# Patient Record
Sex: Female | Born: 1935 | Race: White | Hispanic: No | Marital: Married | Smoking: Never smoker
Health system: Southern US, Community
[De-identification: ages and names within clinical notes are randomized; demographics above are authoritative.]

## PROBLEM LIST (undated history)

## (undated) DIAGNOSIS — I700 Atherosclerosis of aorta: Secondary | ICD-10-CM

## (undated) DIAGNOSIS — T8859XA Other complications of anesthesia, initial encounter: Secondary | ICD-10-CM

## (undated) DIAGNOSIS — M1990 Unspecified osteoarthritis, unspecified site: Secondary | ICD-10-CM

## (undated) DIAGNOSIS — I10 Essential (primary) hypertension: Secondary | ICD-10-CM

## (undated) DIAGNOSIS — R112 Nausea with vomiting, unspecified: Secondary | ICD-10-CM

## (undated) DIAGNOSIS — T4145XA Adverse effect of unspecified anesthetic, initial encounter: Secondary | ICD-10-CM

## (undated) DIAGNOSIS — B0230 Zoster ocular disease, unspecified: Secondary | ICD-10-CM

## (undated) DIAGNOSIS — Z9889 Other specified postprocedural states: Secondary | ICD-10-CM

## (undated) DIAGNOSIS — K219 Gastro-esophageal reflux disease without esophagitis: Secondary | ICD-10-CM

## (undated) DIAGNOSIS — H409 Unspecified glaucoma: Secondary | ICD-10-CM

## (undated) DIAGNOSIS — H9190 Unspecified hearing loss, unspecified ear: Secondary | ICD-10-CM

## (undated) DIAGNOSIS — E039 Hypothyroidism, unspecified: Secondary | ICD-10-CM

## (undated) DIAGNOSIS — K8020 Calculus of gallbladder without cholecystitis without obstruction: Secondary | ICD-10-CM

## (undated) HISTORY — PX: BREAST SURGERY: SHX581

## (undated) HISTORY — DX: Calculus of gallbladder without cholecystitis without obstruction: K80.20

## (undated) HISTORY — PX: ABDOMINAL HYSTERECTOMY: SHX81

---

## 2001-03-01 ENCOUNTER — Encounter: Payer: Self-pay | Admitting: *Deleted

## 2001-03-01 ENCOUNTER — Emergency Department (HOSPITAL_COMMUNITY): Admission: EM | Admit: 2001-03-01 | Discharge: 2001-03-01 | Payer: Self-pay | Admitting: *Deleted

## 2004-04-05 ENCOUNTER — Ambulatory Visit: Payer: Self-pay | Admitting: Ophthalmology

## 2004-10-26 ENCOUNTER — Ambulatory Visit: Payer: Self-pay | Admitting: Ophthalmology

## 2004-11-08 ENCOUNTER — Ambulatory Visit: Payer: Self-pay | Admitting: Ophthalmology

## 2008-09-01 ENCOUNTER — Emergency Department: Payer: Self-pay | Admitting: Emergency Medicine

## 2013-03-15 ENCOUNTER — Inpatient Hospital Stay (HOSPITAL_COMMUNITY)
Admission: EM | Admit: 2013-03-15 | Discharge: 2013-03-22 | DRG: 871 | Disposition: A | Discharge status: Home Health | Payer: Medicare Other | Attending: Internal Medicine | Admitting: Internal Medicine

## 2013-03-15 ENCOUNTER — Emergency Department (HOSPITAL_COMMUNITY): Payer: Medicare Other

## 2013-03-15 ENCOUNTER — Encounter (HOSPITAL_COMMUNITY): Payer: Self-pay | Admitting: Emergency Medicine

## 2013-03-15 ENCOUNTER — Other Ambulatory Visit: Payer: Self-pay

## 2013-03-15 DIAGNOSIS — I10 Essential (primary) hypertension: Secondary | ICD-10-CM

## 2013-03-15 DIAGNOSIS — T40605A Adverse effect of unspecified narcotics, initial encounter: Secondary | ICD-10-CM | POA: Diagnosis present

## 2013-03-15 DIAGNOSIS — D539 Nutritional anemia, unspecified: Secondary | ICD-10-CM | POA: Diagnosis present

## 2013-03-15 DIAGNOSIS — E0590 Thyrotoxicosis, unspecified without thyrotoxic crisis or storm: Secondary | ICD-10-CM | POA: Diagnosis present

## 2013-03-15 DIAGNOSIS — E039 Hypothyroidism, unspecified: Secondary | ICD-10-CM | POA: Diagnosis present

## 2013-03-15 DIAGNOSIS — K8309 Other cholangitis: Secondary | ICD-10-CM | POA: Diagnosis present

## 2013-03-15 DIAGNOSIS — N183 Chronic kidney disease, stage 3 unspecified: Secondary | ICD-10-CM | POA: Diagnosis present

## 2013-03-15 DIAGNOSIS — N179 Acute kidney failure, unspecified: Secondary | ICD-10-CM | POA: Diagnosis present

## 2013-03-15 DIAGNOSIS — T424X5A Adverse effect of benzodiazepines, initial encounter: Secondary | ICD-10-CM | POA: Diagnosis present

## 2013-03-15 DIAGNOSIS — IMO0002 Reserved for concepts with insufficient information to code with codable children: Secondary | ICD-10-CM

## 2013-03-15 DIAGNOSIS — Z66 Do not resuscitate: Secondary | ICD-10-CM | POA: Diagnosis present

## 2013-03-15 DIAGNOSIS — J9600 Acute respiratory failure, unspecified whether with hypoxia or hypercapnia: Secondary | ICD-10-CM | POA: Diagnosis present

## 2013-03-15 DIAGNOSIS — I5031 Acute diastolic (congestive) heart failure: Secondary | ICD-10-CM | POA: Diagnosis present

## 2013-03-15 DIAGNOSIS — A419 Sepsis, unspecified organism: Principal | ICD-10-CM | POA: Diagnosis present

## 2013-03-15 DIAGNOSIS — K219 Gastro-esophageal reflux disease without esophagitis: Secondary | ICD-10-CM | POA: Diagnosis present

## 2013-03-15 DIAGNOSIS — I129 Hypertensive chronic kidney disease with stage 1 through stage 4 chronic kidney disease, or unspecified chronic kidney disease: Secondary | ICD-10-CM | POA: Diagnosis present

## 2013-03-15 DIAGNOSIS — K8050 Calculus of bile duct without cholangitis or cholecystitis without obstruction: Secondary | ICD-10-CM | POA: Diagnosis present

## 2013-03-15 DIAGNOSIS — I509 Heart failure, unspecified: Secondary | ICD-10-CM | POA: Diagnosis present

## 2013-03-15 DIAGNOSIS — K5710 Diverticulosis of small intestine without perforation or abscess without bleeding: Secondary | ICD-10-CM | POA: Diagnosis present

## 2013-03-15 DIAGNOSIS — E860 Dehydration: Secondary | ICD-10-CM | POA: Diagnosis present

## 2013-03-15 DIAGNOSIS — K8051 Calculus of bile duct without cholangitis or cholecystitis with obstruction: Secondary | ICD-10-CM | POA: Diagnosis present

## 2013-03-15 DIAGNOSIS — E46 Unspecified protein-calorie malnutrition: Secondary | ICD-10-CM | POA: Diagnosis present

## 2013-03-15 DIAGNOSIS — R7989 Other specified abnormal findings of blood chemistry: Secondary | ICD-10-CM | POA: Diagnosis present

## 2013-03-15 DIAGNOSIS — Z87440 Personal history of urinary (tract) infections: Secondary | ICD-10-CM

## 2013-03-15 DIAGNOSIS — N289 Disorder of kidney and ureter, unspecified: Secondary | ICD-10-CM

## 2013-03-15 HISTORY — DX: Atherosclerosis of aorta: I70.0

## 2013-03-15 HISTORY — DX: Gastro-esophageal reflux disease without esophagitis: K21.9

## 2013-03-15 HISTORY — DX: Essential (primary) hypertension: I10

## 2013-03-15 LAB — CBC WITH DIFFERENTIAL/PLATELET
Basophils Absolute: 0 10*3/uL (ref 0.0–0.1)
Basophils Relative: 0 % (ref 0–1)
Eosinophils Absolute: 0 10*3/uL (ref 0.0–0.7)
Eosinophils Relative: 0 % (ref 0–5)
HCT: 36.4 % (ref 36.0–46.0)
Hemoglobin: 12.5 g/dL (ref 12.0–15.0)
Lymphocytes Relative: 5 % — ABNORMAL LOW (ref 12–46)
Lymphs Abs: 0.8 10*3/uL (ref 0.7–4.0)
MCH: 35.9 pg — ABNORMAL HIGH (ref 26.0–34.0)
MCHC: 34.3 g/dL (ref 30.0–36.0)
MCV: 104.6 fL — ABNORMAL HIGH (ref 78.0–100.0)
Monocytes Absolute: 0.9 10*3/uL (ref 0.1–1.0)
Monocytes Relative: 5 % (ref 3–12)
Neutro Abs: 16.5 10*3/uL — ABNORMAL HIGH (ref 1.7–7.7)
Neutrophils Relative %: 91 % — ABNORMAL HIGH (ref 43–77)
Platelets: 340 10*3/uL (ref 150–400)
RBC: 3.48 MIL/uL — ABNORMAL LOW (ref 3.87–5.11)
RDW: 16.9 % — ABNORMAL HIGH (ref 11.5–15.5)
WBC: 18.2 10*3/uL — ABNORMAL HIGH (ref 4.0–10.5)

## 2013-03-15 MED ORDER — ONDANSETRON HCL 4 MG/2ML IJ SOLN: 4.0000 mg | Freq: Once | INTRAMUSCULAR | Status: DC

## 2013-03-15 MED ORDER — IOHEXOL 300 MG/ML  SOLN
50.0000 mL | Freq: Once | INTRAMUSCULAR | Status: AC | PRN
  Administered 2013-03-15: 50 mL via ORAL

## 2013-03-15 MED ORDER — MORPHINE SULFATE 4 MG/ML IJ SOLN
4.0000 mg | INTRAMUSCULAR | Status: DC | PRN
  Administered 2013-03-16: 4 mg via INTRAVENOUS
  Filled 2013-03-15: qty 1

## 2013-03-15 MED ORDER — ONDANSETRON HCL 4 MG/2ML IJ SOLN
4.0000 mg | Freq: Once | INTRAMUSCULAR | Status: AC
  Administered 2013-03-15: 4 mg via INTRAVENOUS
  Filled 2013-03-15: qty 2

## 2013-03-15 MED ORDER — SODIUM CHLORIDE 0.9 % IV BOLUS (SEPSIS)
500.0000 mL | Freq: Once | INTRAVENOUS | Status: AC
  Administered 2013-03-16: 500 mL via INTRAVENOUS

## 2013-03-15 NOTE — ED Provider Notes (Signed)
CSN: [id number]     Arrival date & time 03/15/13  2154 History  This chart was scribed for Raeford Razor, MD by Blanchard Kelch, ED Scribe. The patient was seen in room APA09/APA09. Patient's care was started at 11:24 PM.    Chief Complaint  Patient presents with  . Abdominal Pain    Patient is a 80 y.o. female presenting with abdominal pain. The history is provided by the patient and a relative. No language interpreter was used.  Abdominal Pain Pain location:  Epigastric Onset quality:  Sudden Duration:  1 week Timing:  Intermittent Progression:  Worsening Chronicity:  New Associated symptoms: diarrhea, fever and nausea   Associated symptoms: no constipation   Risk factors: being elderly     HPI Comments: Erica Dunn is a 80 y.o. female brought in by ambulance, who presents to the Emergency Department complaining of intermittent episodes of epigastric abdominal pain that began about a week ago but became constant and worsened today. She has associated nausea, yellow diarrhea, subjective intermittent fever and her daughter states her skin color changed to yellow a week ago. She was given Tramadol but was unable to tolerate it. She denies back pain, dysuria, hematuria or leg swelling. She has not been seen by a physician for the pain. She reports still having her gall bladder. She has a past surgical history of a cesarean section. She denies any allergies to medications that she knows of.    Her PCP is Dr. Iran Ouch.  Past Medical History  Diagnosis Date  . GERD (gastroesophageal reflux disease)    History reviewed. No pertinent past surgical history. No family history on file. History  Substance Use Topics  . Smoking status: Never Smoker   . Smokeless tobacco: Not on file  . Alcohol Use: No   OB History   Grav Para Term Preterm Abortions TAB SAB Ect Mult Living                 Review of Systems  Constitutional: Positive for fever.  Gastrointestinal: Positive for nausea,  abdominal pain and diarrhea. Negative for constipation.  Skin: Positive for color change.  All other systems reviewed and are negative.    Allergies  Review of patient's allergies indicates no known allergies.  Home Medications   Current Outpatient Rx  Name  Route  Sig  Dispense  Refill  . acyclovir (ZOVIRAX) 800 MG tablet   Oral   Take 800 mg by mouth 2 (two) times daily.         . beta carotene w/minerals (OCUVITE) tablet   Oral   Take 1 tablet by mouth 2 (two) times daily.         . Calcium Carbonate-Vitamin D (CALCIUM 600+D) 600-400 MG-UNIT per tablet   Oral   Take 1 tablet by mouth 2 (two) times daily.         . cyanocobalamin (,VITAMIN B-12,) 1000 MCG/ML injection   Intramuscular   Inject 1,000 mcg into the muscle every 30 (thirty) days.         . Difluprednate (DUREZOL) 0.05 % EMUL   Left Eye   Place 1 drop into the left eye daily.         . enalapril (VASOTEC) 20 MG tablet   Oral   Take 20 mg by mouth daily.         . furosemide (LASIX) 20 MG tablet   Oral   Take 20 mg by mouth daily.         Marland Kitchen  levothyroxine (SYNTHROID, LEVOTHROID) 50 MCG tablet   Oral   Take 50 mcg by mouth daily before breakfast.         . omeprazole (PRILOSEC) 20 MG capsule   Oral   Take 20 mg by mouth 2 (two) times daily.         . timolol (TIMOPTIC) 0.5 % ophthalmic solution   Right Eye   Place 1 drop into the right eye daily.         . Vitamin D, Ergocalciferol, (DRISDOL) 50000 UNITS CAPS capsule   Oral   Take 50,000 Units by mouth every 30 (thirty) days.          Triage Vitals: BP 160/77  Pulse 75  Temp(Src) 97.8 F (36.6 C) (Oral)  Resp 18  Wt 100 lb (45.36 kg)  SpO2 98%  Physical Exam  Nursing note and vitals reviewed. Constitutional: She is oriented to person, place, and time. She appears well-developed and well-nourished. No distress.  Frail and ill appearing. Jaundiced.   HENT:  Head: Normocephalic and atraumatic.  Right Ear: External  ear normal.  Left Ear: External ear normal.  Eyes: Pupils are equal, round, and reactive to light. Scleral icterus is present.  Neck: Neck supple.  Cardiovascular: Normal rate, regular rhythm and normal heart sounds.   Pulmonary/Chest: Effort normal. No respiratory distress. She has no wheezes.  Abdominal: Soft. Bowel sounds are normal. She exhibits no distension. There is tenderness in the epigastric area. There is guarding (voluntary). There is no rebound.  Midline surgical scar lower abdomen.   Neurological: She is alert and oriented to person, place, and time.  Skin: Skin is warm and dry. She is not diaphoretic.  Psychiatric: She has a normal mood and affect.    ED Course  Procedures (including critical care time)  DIAGNOSTIC STUDIES: Oxygen Saturation is 98% on room air, normal by my interpretation.    COORDINATION OF CARE: 11:28 PM -Will order CBC, CMP, blood lipase, CT abdomen and zofran injection. Patient verbalizes understanding and agrees with treatment plan.    Labs Review Labs Reviewed  CBC WITH DIFFERENTIAL - Abnormal; Notable for the following:    WBC 18.2 (*)    RBC 3.48 (*)    MCV 104.6 (*)    MCH 35.9 (*)    RDW 16.9 (*)    Neutrophils Relative % 91 (*)    Neutro Abs 16.5 (*)    Lymphocytes Relative 5 (*)    All other components within normal limits  COMPREHENSIVE METABOLIC PANEL - Abnormal; Notable for the following:    Chloride 94 (*)    Glucose, Bld 169 (*)    Creatinine, Ser 1.66 (*)    Albumin 3.2 (*)    AST 206 (*)    ALT 88 (*)    Alkaline Phosphatase 573 (*)    Total Bilirubin 11.8 (*)    GFR calc non Af Amer 26 (*)    GFR calc Af Amer 30 (*)    All other components within normal limits  LIPASE, BLOOD   Imaging Review Ct Abdomen Pelvis Wo Contrast  03/16/2013   CLINICAL DATA:  Nausea and upper abdominal pain.  EXAM: CT ABDOMEN AND PELVIS WITHOUT CONTRAST  TECHNIQUE: Multidetector CT imaging of the abdomen and pelvis was performed following  the standard protocol without intravenous contrast.  COMPARISON:  None.  FINDINGS: A trace right pleural effusion is noted, with associated atelectasis. Mild apparent wall thickening at the distal esophagus may reflect sequelae of gastroesophageal  reflux.  Scattered calcified granulomata are seen within the liver and spleen. There is prominence of the intrahepatic biliary ducts. The common hepatic duct is dilated to 1.5 cm in diameter, with two obstructing stones seen distally at the level of the pancreatic head, measuring 1.1 cm and 1.0 cm. The gallbladder is markedly distended, with a 2.9 cm focus of mildly increased attenuation possibly reflecting a sludge ball or stone.  The spleen is otherwise unremarkable in appearance. The pancreas and adrenal glands are within normal limits.  The kidneys are unremarkable in appearance. There is no evidence of hydronephrosis. No renal or ureteral stones are seen. No perinephric stranding is appreciated.  No free fluid is identified. The small bowel is unremarkable in appearance. The stomach is within normal limits. No acute vascular abnormalities are seen. Diffuse calcification is noted along the abdominal aorta and its branches.  The appendix is normal in caliber, without evidence for appendicitis. Stool is seen partially filling the colon. Scattered diverticulosis is noted along the sigmoid colon, without evidence of diverticulitis.  The bladder is mildly distended and grossly unremarkable in appearance. The patient is status post hysterectomy. The ovaries are relatively symmetric; no suspicious adnexal masses are seen. No inguinal lymphadenopathy is seen.  No acute osseous abnormalities are identified. Vacuum phenomenon is noted at L3-L4.  IMPRESSION: 1. Markedly distended gallbladder, with a 2.9 cm focus of mildly increased attenuation possibly reflecting a large stone or sludge ball. Dilatation of the common hepatic duct to 1.5 cm in diameter, with two obstructing stones  seen distally at the level of the pancreatic head, measuring 1.1 cm and 1.0 cm. Associated prominence of the intrahepatic biliary ducts. ERCP would be helpful for further evaluation and treatment. 2. Trace right pleural effusion, with associated atelectasis. 3. Mild apparent wall thickening at the distal esophagus may reflect sequelae of gastroesophageal reflux. 4. Diffuse calcification along the abdominal aorta and its branches. 5. Scattered diverticulosis along the sigmoid colon, without evidence of diverticulitis.  These results were called by telephone at the time of interpretation on 03/16/2013 at 1:57 AM to Dr. Raeford RazorSTEPHEN Sabri Teal, who verbally acknowledged these results.   Electronically Signed   By: Roanna RaiderJeffery  Chang M.D.   On: 03/16/2013 01:57    EKG Interpretation   None       MDM   1. Choledocholithiasis with obstruction   2. Renal insufficiency    89yF with abdominal pain, n/v and jaundice. CT significant for choledocholithiasis/cholelithiasis. Afebrile in ED, but subjective fever and leukocytosis. No hypotension. Mentating at baseline. Consider cholangitis, but not clinically convincing. Abx ordered though. Discussed with Dr Karilyn Cotaehman, GI. Plans ERCP later this morning.   3:11 AM Pt admitted. Notified by nursing that hypotensive now after being consistently hypertensive. Pt with no new complaints. Says she feels much better than on arrival. Abdominal exam unchanged from initial. Has been a couple hours since last dose morphine. BP confirmed manually. IVF bolus ordered. Discussed with Dr Sharl MaLama in ED. Will change bed to stepdown. Will continue to monitor while in ED.   I personally preformed the services scribed in my presence. The recorded information has been reviewed is accurate. Raeford RazorStephen Razan Siler, MD.    Raeford RazorStephen Jionni Helming, MD 03/16/13 [phone]

## 2013-03-15 NOTE — ED Notes (Signed)
Patient presents to ER via [facility] with c/o epigastric pain, worse when she eats with nausea and vomiting.

## 2013-03-15 NOTE — ED Notes (Signed)
Patient states she thinks if she could vomit, she would feel better.

## 2013-03-16 ENCOUNTER — Inpatient Hospital Stay (HOSPITAL_COMMUNITY): Payer: Medicare Other

## 2013-03-16 ENCOUNTER — Encounter (HOSPITAL_COMMUNITY)
Admission: EM | Disposition: A | Discharge status: Home Health | Payer: Self-pay | Source: Home / Self Care | Attending: Internal Medicine

## 2013-03-16 ENCOUNTER — Encounter (HOSPITAL_COMMUNITY): Payer: Self-pay | Admitting: *Deleted

## 2013-03-16 ENCOUNTER — Encounter (HOSPITAL_COMMUNITY): Payer: Medicare Other | Admitting: Anesthesiology

## 2013-03-16 ENCOUNTER — Inpatient Hospital Stay (HOSPITAL_COMMUNITY): Payer: Medicare Other | Admitting: Anesthesiology

## 2013-03-16 DIAGNOSIS — K8051 Calculus of bile duct without cholangitis or cholecystitis with obstruction: Secondary | ICD-10-CM

## 2013-03-16 DIAGNOSIS — A419 Sepsis, unspecified organism: Secondary | ICD-10-CM | POA: Diagnosis not present

## 2013-03-16 DIAGNOSIS — I10 Essential (primary) hypertension: Secondary | ICD-10-CM

## 2013-03-16 DIAGNOSIS — K8309 Other cholangitis: Secondary | ICD-10-CM

## 2013-03-16 DIAGNOSIS — N179 Acute kidney failure, unspecified: Secondary | ICD-10-CM | POA: Diagnosis not present

## 2013-03-16 DIAGNOSIS — I5031 Acute diastolic (congestive) heart failure: Secondary | ICD-10-CM | POA: Diagnosis not present

## 2013-03-16 DIAGNOSIS — K8050 Calculus of bile duct without cholangitis or cholecystitis without obstruction: Secondary | ICD-10-CM | POA: Diagnosis present

## 2013-03-16 DIAGNOSIS — J9600 Acute respiratory failure, unspecified whether with hypoxia or hypercapnia: Secondary | ICD-10-CM | POA: Diagnosis not present

## 2013-03-16 HISTORY — PX: REMOVAL OF STONES: SHX5545

## 2013-03-16 HISTORY — PX: SPHINCTEROTOMY: SHX5544

## 2013-03-16 HISTORY — PX: ERCP: SHX5425

## 2013-03-16 HISTORY — PX: BILIARY STENT PLACEMENT: SHX5538

## 2013-03-16 HISTORY — PX: BALLOON DILATION: SHX5330

## 2013-03-16 LAB — BLOOD GAS, ARTERIAL
Acid-base deficit: 2.8 mmol/L — ABNORMAL HIGH (ref 0.0–2.0)
Bicarbonate: 23 mEq/L (ref 20.0–24.0)
DRAWN BY: 234301
EXPIRATORY PAP: 5
FIO2: 100 %
INSPIRATORY PAP: 20
Mode: POSITIVE
O2 Saturation: 99.3 %
PCO2 ART: 51.4 mmHg — AB (ref 35.0–45.0)
PO2 ART: 259 mmHg — AB (ref 80.0–100.0)
Patient temperature: 37
TCO2: 20 mmol/L (ref 0–100)
pH, Arterial: 7.274 — ABNORMAL LOW (ref 7.350–7.450)

## 2013-03-16 LAB — COMPREHENSIVE METABOLIC PANEL
ALT: 88 U/L — ABNORMAL HIGH (ref 0–35)
ALT: 68 U/L — ABNORMAL HIGH (ref 0–35)
AST: 206 U/L — ABNORMAL HIGH (ref 0–37)
AST: 157 U/L — AB (ref 0–37)
Albumin: 3.2 g/dL — ABNORMAL LOW (ref 3.5–5.2)
Albumin: 2.4 g/dL — ABNORMAL LOW (ref 3.5–5.2)
Alkaline Phosphatase: 573 U/L — ABNORMAL HIGH (ref 39–117)
Alkaline Phosphatase: 438 U/L — ABNORMAL HIGH (ref 39–117)
BUN: 20 mg/dL (ref 6–23)
BUN: 21 mg/dL (ref 6–23)
CHLORIDE: 101 meq/L (ref 96–112)
CO2: 29 mEq/L (ref 19–32)
CO2: 26 meq/L (ref 19–32)
CREATININE: 1.55 mg/dL — AB (ref 0.50–1.10)
Calcium: 10.1 mg/dL (ref 8.4–10.5)
Calcium: 8.5 mg/dL (ref 8.4–10.5)
Chloride: 94 mEq/L — ABNORMAL LOW (ref 96–112)
Creatinine, Ser: 1.66 mg/dL — ABNORMAL HIGH (ref 0.50–1.10)
GFR calc Af Amer: 30 mL/min — ABNORMAL LOW (ref >90)
GFR calc Af Amer: 33 mL/min — ABNORMAL LOW (ref >90)
GFR calc non Af Amer: 26 mL/min — ABNORMAL LOW (ref >90)
GFR, EST NON AFRICAN AMERICAN: 28 mL/min — AB (ref >90)
Glucose, Bld: 169 mg/dL — ABNORMAL HIGH (ref 70–99)
Glucose, Bld: 134 mg/dL — ABNORMAL HIGH (ref 70–99)
Potassium: 4.2 mEq/L (ref 3.7–5.3)
Potassium: 3.9 mEq/L (ref 3.7–5.3)
Sodium: 140 mEq/L (ref 137–147)
Sodium: 142 mEq/L (ref 137–147)
Total Bilirubin: 11.8 mg/dL — ABNORMAL HIGH (ref 0.3–1.2)
Total Bilirubin: 10.7 mg/dL — ABNORMAL HIGH (ref 0.3–1.2)
Total Protein: 8.3 g/dL (ref 6.0–8.3)
Total Protein: 6.8 g/dL (ref 6.0–8.3)

## 2013-03-16 LAB — TSH: TSH: 0.845 u[IU]/mL (ref 0.350–4.500)

## 2013-03-16 LAB — CBC
HCT: 31.9 % — ABNORMAL LOW (ref 36.0–46.0)
Hemoglobin: 10.8 g/dL — ABNORMAL LOW (ref 12.0–15.0)
MCH: 35.6 pg — AB (ref 26.0–34.0)
MCHC: 33.9 g/dL (ref 30.0–36.0)
MCV: 105.3 fL — ABNORMAL HIGH (ref 78.0–100.0)
PLATELETS: 261 10*3/uL (ref 150–400)
RBC: 3.03 MIL/uL — AB (ref 3.87–5.11)
RDW: 18.1 % — ABNORMAL HIGH (ref 11.5–15.5)
WBC: 20.5 10*3/uL — ABNORMAL HIGH (ref 4.0–10.5)

## 2013-03-16 LAB — PROTIME-INR
INR: 1.08 (ref 0.00–1.49)
PROTHROMBIN TIME: 13.8 s (ref 11.6–15.2)

## 2013-03-16 LAB — PRO B NATRIURETIC PEPTIDE: PRO B NATRI PEPTIDE: 7350 pg/mL — AB (ref 0–450)

## 2013-03-16 LAB — LIPASE, BLOOD: Lipase: 27 U/L (ref 11–59)

## 2013-03-16 LAB — SURGICAL PCR SCREEN
MRSA, PCR: NEGATIVE
STAPHYLOCOCCUS AUREUS: NEGATIVE

## 2013-03-16 SURGERY — ERCP, WITH INTERVENTION IF INDICATED
Anesthesia: General

## 2013-03-16 MED ORDER — ONDANSETRON HCL 4 MG/2ML IJ SOLN
INTRAMUSCULAR | Status: AC
  Filled 2013-03-16: qty 2

## 2013-03-16 MED ORDER — GLUCAGON HCL (RDNA) 1 MG IJ SOLR
INTRAMUSCULAR | Status: DC | PRN
  Administered 2013-03-16 (×3): 0.25 mg via INTRAVENOUS

## 2013-03-16 MED ORDER — ONDANSETRON HCL 4 MG PO TABS: 4.0000 mg | ORAL_TABLET | Freq: Four times a day (QID) | ORAL | Status: DC | PRN

## 2013-03-16 MED ORDER — GLYCOPYRROLATE 0.2 MG/ML IJ SOLN
INTRAMUSCULAR | Status: AC
  Filled 2013-03-16: qty 1

## 2013-03-16 MED ORDER — PHENYLEPHRINE HCL 10 MG/ML IJ SOLN
INTRAMUSCULAR | Status: AC
  Filled 2013-03-16: qty 1

## 2013-03-16 MED ORDER — LIDOCAINE HCL (CARDIAC) 20 MG/ML IV SOLN
INTRAVENOUS | Status: DC | PRN
  Administered 2013-03-16: 50 mg via INTRAVENOUS

## 2013-03-16 MED ORDER — LEVOTHYROXINE SODIUM 50 MCG PO TABS
50.0000 ug | ORAL_TABLET | Freq: Every day | ORAL | Status: DC
  Administered 2013-03-16 – 2013-03-22 (×7): 50 ug via ORAL
  Filled 2013-03-16: qty 2
  Filled 2013-03-16: qty 1
  Filled 2013-03-16 (×2): qty 2
  Filled 2013-03-16: qty 1
  Filled 2013-03-16: qty 2
  Filled 2013-03-16: qty 1

## 2013-03-16 MED ORDER — FENTANYL CITRATE 0.05 MG/ML IJ SOLN
25.0000 ug | INTRAMUSCULAR | Status: DC | PRN
  Administered 2013-03-16 (×2): 25 ug via INTRAVENOUS

## 2013-03-16 MED ORDER — SODIUM CHLORIDE 0.9 % IV SOLN
INTRAVENOUS | Status: DC
  Administered 2013-03-16: 100 mL/h via INTRAVENOUS
  Administered 2013-03-17: 03:00:00 via INTRAVENOUS

## 2013-03-16 MED ORDER — SODIUM CHLORIDE 0.9 % IV BOLUS (SEPSIS)
1000.0000 mL | Freq: Once | INTRAVENOUS | Status: AC
  Administered 2013-03-16: 1000 mL via INTRAVENOUS

## 2013-03-16 MED ORDER — SODIUM CHLORIDE 0.9 % IV SOLN: INTRAVENOUS | Status: DC

## 2013-03-16 MED ORDER — NALOXONE HCL 0.4 MG/ML IJ SOLN
0.4000 mg | INTRAMUSCULAR | Status: DC | PRN
  Administered 2013-03-16 (×3): 0.4 mg via INTRAVENOUS

## 2013-03-16 MED ORDER — ETOMIDATE 2 MG/ML IV SOLN
INTRAVENOUS | Status: AC
  Filled 2013-03-16: qty 10

## 2013-03-16 MED ORDER — MORPHINE SULFATE 2 MG/ML IJ SOLN
2.0000 mg | INTRAMUSCULAR | Status: DC | PRN
  Filled 2013-03-16: qty 1

## 2013-03-16 MED ORDER — STERILE WATER FOR IRRIGATION IR SOLN
Status: DC | PRN
  Administered 2013-03-16: 11:00:00

## 2013-03-16 MED ORDER — FLUMAZENIL 0.5 MG/5ML IV SOLN
INTRAVENOUS | Status: AC
  Administered 2013-03-16 (×2): 0.2 mg
  Filled 2013-03-16: qty 5

## 2013-03-16 MED ORDER — MIDAZOLAM HCL 2 MG/2ML IJ SOLN
INTRAMUSCULAR | Status: AC
  Filled 2013-03-16: qty 2

## 2013-03-16 MED ORDER — PHENYLEPHRINE HCL 10 MG/ML IJ SOLN
INTRAMUSCULAR | Status: DC | PRN
  Administered 2013-03-16 (×2): 100 ug via INTRAVENOUS

## 2013-03-16 MED ORDER — LIDOCAINE HCL (PF) 1 % IJ SOLN
INTRAMUSCULAR | Status: AC
  Filled 2013-03-16: qty 5

## 2013-03-16 MED ORDER — HYDRALAZINE HCL 25 MG PO TABS: 25.0000 mg | ORAL_TABLET | Freq: Four times a day (QID) | ORAL | Status: DC | PRN

## 2013-03-16 MED ORDER — FENTANYL CITRATE 0.05 MG/ML IJ SOLN
INTRAMUSCULAR | Status: AC
  Filled 2013-03-16: qty 5

## 2013-03-16 MED ORDER — SODIUM CHLORIDE 0.9 % IV SOLN
INTRAVENOUS | Status: DC
  Administered 2013-03-16: 06:00:00 via INTRAVENOUS

## 2013-03-16 MED ORDER — TIMOLOL MALEATE 0.5 % OP SOLN
1.0000 [drp] | Freq: Every day | OPHTHALMIC | Status: DC
  Administered 2013-03-16 – 2013-03-22 (×7): 1 [drp] via OPHTHALMIC
  Filled 2013-03-16 (×2): qty 5

## 2013-03-16 MED ORDER — OCUVITE PO TABS
1.0000 | ORAL_TABLET | Freq: Two times a day (BID) | ORAL | Status: DC
  Administered 2013-03-16 – 2013-03-22 (×11): 1 via ORAL
  Filled 2013-03-16 (×15): qty 1

## 2013-03-16 MED ORDER — ACYCLOVIR 800 MG PO TABS
800.0000 mg | ORAL_TABLET | Freq: Two times a day (BID) | ORAL | Status: DC
  Administered 2013-03-16 – 2013-03-22 (×12): 800 mg via ORAL
  Filled 2013-03-16 (×14): qty 1

## 2013-03-16 MED ORDER — PREDNISOLONE ACETATE 1 % OP SUSP
1.0000 [drp] | Freq: Every day | OPHTHALMIC | Status: DC
  Administered 2013-03-16 – 2013-03-22 (×7): 1 [drp] via OPHTHALMIC
  Filled 2013-03-16: qty 1

## 2013-03-16 MED ORDER — ROCURONIUM BROMIDE 50 MG/5ML IV SOLN
INTRAVENOUS | Status: AC
  Filled 2013-03-16: qty 1

## 2013-03-16 MED ORDER — PIPERACILLIN-TAZOBACTAM IN DEX 2-0.25 GM/50ML IV SOLN
2.2500 g | Freq: Three times a day (TID) | INTRAVENOUS | Status: DC
  Administered 2013-03-16 – 2013-03-20 (×13): 2.25 g via INTRAVENOUS
  Filled 2013-03-16 (×13): qty 50

## 2013-03-16 MED ORDER — ENALAPRIL MALEATE 5 MG PO TABS: 20.0000 mg | ORAL_TABLET | Freq: Every day | ORAL | Status: DC

## 2013-03-16 MED ORDER — SODIUM CHLORIDE BACTERIOSTATIC 0.9 % IJ SOLN
INTRAMUSCULAR | Status: AC
  Filled 2013-03-16: qty 10

## 2013-03-16 MED ORDER — ONDANSETRON HCL 4 MG/2ML IJ SOLN: 4.0000 mg | Freq: Once | INTRAMUSCULAR | Status: AC | PRN

## 2013-03-16 MED ORDER — FUROSEMIDE 10 MG/ML IJ SOLN
40.0000 mg | Freq: Once | INTRAMUSCULAR | Status: AC
  Administered 2013-03-16: 40 mg via INTRAVENOUS
  Filled 2013-03-16: qty 4

## 2013-03-16 MED ORDER — PROMETHAZINE HCL 25 MG/ML IJ SOLN
6.2500 mg | Freq: Once | INTRAMUSCULAR | Status: AC
  Administered 2013-03-16: 6.25 mg via INTRAVENOUS
  Filled 2013-03-16: qty 1

## 2013-03-16 MED ORDER — ONDANSETRON HCL 4 MG/2ML IJ SOLN
4.0000 mg | Freq: Once | INTRAMUSCULAR | Status: AC
  Administered 2013-03-16: 4 mg via INTRAVENOUS

## 2013-03-16 MED ORDER — EPHEDRINE SULFATE 50 MG/ML IJ SOLN
INTRAMUSCULAR | Status: AC
  Filled 2013-03-16: qty 1

## 2013-03-16 MED ORDER — PIPERACILLIN-TAZOBACTAM 3.375 G IVPB 30 MIN
3.3750 g | Freq: Once | INTRAVENOUS | Status: AC
Start: 2013-03-16 — End: 2013-03-16
  Administered 2013-03-16: 3.375 g via INTRAVENOUS
  Filled 2013-03-16 (×2): qty 50

## 2013-03-16 MED ORDER — HYDRALAZINE HCL 20 MG/ML IJ SOLN: 10.0000 mg | Freq: Four times a day (QID) | INTRAMUSCULAR | Status: DC | PRN

## 2013-03-16 MED ORDER — ATROPINE SULFATE 0.4 MG/ML IJ SOLN
INTRAMUSCULAR | Status: AC
  Filled 2013-03-16: qty 1

## 2013-03-16 MED ORDER — SODIUM CHLORIDE 0.9 % IJ SOLN
INTRAMUSCULAR | Status: DC | PRN
  Administered 2013-03-16: 11:00:00

## 2013-03-16 MED ORDER — MIDAZOLAM HCL 2 MG/2ML IJ SOLN
1.0000 mg | INTRAMUSCULAR | Status: DC | PRN
  Administered 2013-03-16: 2 mg via INTRAVENOUS

## 2013-03-16 MED ORDER — FENTANYL CITRATE 0.05 MG/ML IJ SOLN
INTRAMUSCULAR | Status: AC
  Filled 2013-03-16: qty 2

## 2013-03-16 MED ORDER — PROPOFOL 10 MG/ML IV EMUL
INTRAVENOUS | Status: AC
  Filled 2013-03-16: qty 20

## 2013-03-16 MED ORDER — SEVOFLURANE IN SOLN
RESPIRATORY_TRACT | Status: AC
  Filled 2013-03-16: qty 250

## 2013-03-16 MED ORDER — PROPOFOL 10 MG/ML IV BOLUS
INTRAVENOUS | Status: DC | PRN
  Administered 2013-03-16: 80 mg via INTRAVENOUS

## 2013-03-16 MED ORDER — GLYCOPYRROLATE 0.2 MG/ML IJ SOLN
INTRAMUSCULAR | Status: DC | PRN
  Administered 2013-03-16 (×2): 0.3 mg via INTRAVENOUS

## 2013-03-16 MED ORDER — GLYCOPYRROLATE 0.2 MG/ML IJ SOLN
0.2000 mg | Freq: Once | INTRAMUSCULAR | Status: AC
  Administered 2013-03-16: 0.2 mg via INTRAVENOUS

## 2013-03-16 MED ORDER — LACTATED RINGERS IV SOLN
INTRAVENOUS | Status: DC
  Administered 2013-03-16: 10:00:00 via INTRAVENOUS

## 2013-03-16 MED ORDER — GLYCOPYRROLATE 0.2 MG/ML IJ SOLN
INTRAMUSCULAR | Status: AC
  Filled 2013-03-16: qty 3

## 2013-03-16 MED ORDER — NEOSTIGMINE METHYLSULFATE 1 MG/ML IJ SOLN
INTRAMUSCULAR | Status: DC | PRN
  Administered 2013-03-16 (×2): 2 mg via INTRAVENOUS

## 2013-03-16 MED ORDER — LACTATED RINGERS IV SOLN
INTRAVENOUS | Status: DC | PRN
  Administered 2013-03-16: 10:00:00 via INTRAVENOUS

## 2013-03-16 MED ORDER — SUCCINYLCHOLINE CHLORIDE 20 MG/ML IJ SOLN
INTRAMUSCULAR | Status: AC
  Filled 2013-03-16: qty 1

## 2013-03-16 MED ORDER — GLUCAGON HCL (RDNA) 1 MG IJ SOLR
INTRAMUSCULAR | Status: AC
  Filled 2013-03-16: qty 2

## 2013-03-16 MED ORDER — ENOXAPARIN SODIUM 40 MG/0.4ML ~~LOC~~ SOLN: 40.0000 mg | SUBCUTANEOUS | Status: DC

## 2013-03-16 MED ORDER — ROCURONIUM BROMIDE 100 MG/10ML IV SOLN
INTRAVENOUS | Status: DC | PRN
  Administered 2013-03-16: 35 mg via INTRAVENOUS

## 2013-03-16 MED ORDER — SODIUM CHLORIDE BACTERIOSTATIC 0.9 % IJ SOLN
INTRAMUSCULAR | Status: AC
  Filled 2013-03-16: qty 20

## 2013-03-16 MED ORDER — SODIUM CHLORIDE 0.9 % IV SOLN
INTRAVENOUS | Status: DC
Start: 2013-03-16 — End: 2013-03-16

## 2013-03-16 MED ORDER — ONDANSETRON HCL 4 MG/2ML IJ SOLN
4.0000 mg | Freq: Four times a day (QID) | INTRAMUSCULAR | Status: DC | PRN
  Administered 2013-03-17: 4 mg via INTRAVENOUS
  Filled 2013-03-16: qty 2

## 2013-03-16 MED ORDER — FENTANYL CITRATE 0.05 MG/ML IJ SOLN
INTRAMUSCULAR | Status: DC | PRN
  Administered 2013-03-16 (×2): 50 ug via INTRAVENOUS

## 2013-03-16 MED ORDER — NEOSTIGMINE METHYLSULFATE 1 MG/ML IJ SOLN
INTRAMUSCULAR | Status: AC
  Filled 2013-03-16: qty 1

## 2013-03-16 MED ORDER — SODIUM CHLORIDE 0.9 % IV BOLUS (SEPSIS): 250.0000 mL | INTRAVENOUS | Status: DC | PRN

## 2013-03-16 SURGICAL SUPPLY — 22 items
BAG HAMPER (MISCELLANEOUS) ×3 IMPLANT
BALLN CRE LF 10-12 240X5.5 (BALLOONS) ×2
BALLN CRE LF 10-12MM 240X5.5 (BALLOONS) ×1
BALLN RETRIEVAL 12X15 (BALLOONS) ×1 IMPLANT
BALLN RETRIEVAL 12X15MM (BALLOONS) ×1
BALLOON CRE LF 10-12 240X5.5 (BALLOONS) IMPLANT
BALN RTRVL 200 6-7FR 12-15 (BALLOONS) ×1
BASKET TRAPEZOID LITHO 2.5X5 (MISCELLANEOUS) ×2 IMPLANT
DEVICE LOCKING W-BIOPSY CAP (MISCELLANEOUS) ×3 IMPLANT
KIT CLEAN ENDO COMPLIANCE (KITS) ×3 IMPLANT
KIT ROOM TURNOVER APOR (KITS) ×3 IMPLANT
LUBRICANT JELLY 4.5OZ STERILE (MISCELLANEOUS) ×2 IMPLANT
PAD ARMBOARD 7.5X6 YLW CONV (MISCELLANEOUS) ×3 IMPLANT
POSITIONER HEAD 8X9X4 ADT (SOFTGOODS) ×2 IMPLANT
SPHINCTEROTOME HYDRATOME 44 (MISCELLANEOUS) ×4 IMPLANT
SPONGE GAUZE 4X4 12PLY (GAUZE/BANDAGES/DRESSINGS) ×3 IMPLANT
STENT RX PRELOADED 10FRX9CM (Stent) ×2 IMPLANT
SYR 50ML LL SCALE MARK (SYRINGE) ×4 IMPLANT
SYR INFLATION 60ML (SYRINGE) ×2 IMPLANT
SYSTEM CONTINUOUS INJECTION (MISCELLANEOUS) ×3 IMPLANT
TUBING ENDO SMARTCAP PENTAX (MISCELLANEOUS) ×3 IMPLANT
WATER STERILE IRR 1000ML POUR (IV SOLUTION) ×5 IMPLANT

## 2013-03-16 NOTE — ED Notes (Signed)
In to evaluate patient prior to transport - recheck of v/s  Now hypotensive. See BP 's    Manual BP 74/40. Patient alert and responsive.  States she feels so much better than she did on arrival.   IV fluid bolus initiated and informed Dr Juleen ChinaKohut - orders received.  Dr Sharl MaLama also informed.  Bed request changed to step down.

## 2013-03-16 NOTE — Progress Notes (Signed)
From OR. Arousing. Oriented to place per nurse. Uncooperative. Moaning. Groaning. Reassurance given. Refuses to leave non-rebreather mask in place. O2 sat 88% on RA. O2 restarted with nasal cannula at 2l/m. Tolerated well. O2 sat increased to 94% and above.

## 2013-03-16 NOTE — ED Notes (Signed)
Patient vomited clear liquid, c/o severe nausea that has continued after initial Zofran.  MD notified and orders received.

## 2013-03-16 NOTE — Anesthesia Preprocedure Evaluation (Addendum)
Anesthesia Evaluation  Patient identified by MRN, date of birth, ID band Patient awake    Reviewed: Allergy & Precautions, H&P , NPO status , Patient's Chart, lab work & pertinent test results  Airway Mallampati: III TM Distance: <3 FB Neck ROM: Full    Dental  (+) Teeth Intact   Pulmonary neg pulmonary ROS,  breath sounds clear to auscultation        Cardiovascular hypertension, Pt. on medications Rhythm:Regular Rate:Normal     Neuro/Psych    GI/Hepatic GERD-  Medicated and Controlled,  Endo/Other  Hypothyroidism   Renal/GU      Musculoskeletal   Abdominal   Peds  Hematology   Anesthesia Other Findings   Reproductive/Obstetrics                          Anesthesia Physical Anesthesia Plan  ASA: III and emergent  Anesthesia Plan: General   Post-op Pain Management:    Induction: Intravenous, Rapid sequence and Cricoid pressure planned  Airway Management Planned: Oral ETT  Additional Equipment:   Intra-op Plan:   Post-operative Plan: Extubation in OR  Informed Consent: I have reviewed the patients History and Physical, chart, labs and discussed the procedure including the risks, benefits and alternatives for the proposed anesthesia with the patient or authorized representative who has indicated his/her understanding and acceptance.     Plan Discussed with: CRNA  Anesthesia Plan Comments:        Anesthesia Quick Evaluation

## 2013-03-16 NOTE — Transfer of Care (Signed)
Immediate Anesthesia Transfer of Care Note  Patient: Erica Dunn  Procedure(s) Performed: Procedure(s): ENDOSCOPIC RETROGRADE CHOLANGIOPANCREATOGRAPHY (ERCP) (Duct not cleared of all stones) SPHINCTEROTOMY BALLOON DILATION REMOVAL OF STONES WITH STONE BASKET BILIARY STENT PLACEMENT  Patient Location: PACU  Anesthesia Type:General  Level of Consciousness: sedated  Airway & Oxygen Therapy: Patient Spontanous Breathing and Patient connected to face mask oxygen  Post-op Assessment: Report given to PACU RN, Post -op Vital signs reviewed and stable and Patient moving all extremities  Post vital signs: Reviewed and stable  Complications: No apparent anesthesia complications

## 2013-03-16 NOTE — Progress Notes (Signed)
Swallowing without difficulty. 

## 2013-03-16 NOTE — Op Note (Addendum)
ERCP PROCEDURE REPORT  PATIENT:  Erica Dunn  MR#:  [id number] Birthdate:  08/28/1920, 80 y.o., female Endoscopist:  Dr. Malissa HippoNajeeb U. Casondra Gasca, MD Referred By:  Dr. Cindra PresumeUlugbeck [name], MD Procedure Date: 03/16/2013  Procedure:   ERCP with sphincterotomy and biliary stenting.    Indications:  Patient is 80 year old Caucasian female who presents with abdominal pain and low-grade fever and jaundice. CT reveals dilated biliary system with 2 large stones as well as distended gallbladder with stones.            Informed Consent:  The risks, benefits, limitations, alternatives, and mponderable have been reviewed with the patient. I specifically discussed a 1 in 10 chance of pancreatitis, reaction to medications, bleeding, perforation and the possibility of a failed ERCP. Potential for sphincterotomy and stent placement also reviewed. Questions have been answered. All parties agreeable.  Please see history & physical in medical record for more information.  Medications:  Please see anesthesia record for complete details Patient was given general endotracheal anesthesia.  Description of procedure:  Procedure performed in the OR. The patient was placed under anesthesia, intubated, and turned into semipermanent position. Therapeutic Pentax video duodenoscope passed through the oropharynx without any difficulty into the esophagus, stomach, and across the pylorus and pull, and descending duodenum.  Bile duct was easily cannulated with RX 44 sphincterotome and 035 hydrajag wire.  Findings:  Two duodenal diverticula. Normal ampulla of Vater with long intramural segment. Dilated CBD and CHD with 2 large filling defects consistent with stones. Narrowing noted in the region of common hepatic duct felt to be secondary to extrinsic pressure from distended gallbladder. Mucopurulent material noted on cannulation of cystic duct. Biliary sphincterotomy performed and sphincterotomy dilated to 10 mm with esophageal  balloon. Stone fragments removed using mechanical lithotripter all of the fragments could not be removed. 10 French 9 cm long plastic stent was placed for biliary decompression using [employer] scientific system. Pancreatic duct was not cannulated or filled with contrast.   Therapeutic/Diagnostic Maneuvers Performed:  See above  Complications:  None  Impression:  Dilated biliary system with two large calcified stones. Stone fragments removed using mechanical lithotripter but  not be cleared of all pieces despite balloon dilation of ampulla. 10 French 9 cm long plastic stent laser biliary decompression.  Recommendations:  Continue Zosyn. Clear liquids. Lab studies in a.m. including serum amylase. Surgical consultation with Dr. Lovell SheehanJenkins. Repeat ERCP in 6-8 weeks.    [name] U  03/16/2013  12:11 PM  CC: Dr. Iran OuchStrader, Alleen BorneLindsey F, MD & Dr. Bonnetta BarryNo ref. provider found

## 2013-03-16 NOTE — OR Nursing (Signed)
Scanned versed 2mg . Only 1mg  given in preoperative area. Report off to Amy AdamsCRNA 1mg  wasted via pyxis.

## 2013-03-16 NOTE — Progress Notes (Signed)
Awake. Continues to be uncooperative. Refuses po fluids. Answers yes/no questions appropriately. Swallowing without difficulty. Resp adequate/nonlabored. O2 continued via nasal cannula. Refuses to leave blankets on. Reassurance given.

## 2013-03-16 NOTE — Consult Note (Signed)
Reason for Consult: Referring Physician: Hospitalist  Keeshia Muscat is an 80 y.o. female.  HPI: Admitted early am with rt upper quadrant pain. She had had this pain for approximately 4 days. She has had this pain off and on for about a year. Family states she has been "yellow" for about a week. She had nausea associated with her symptoms.   Appetite has not been good for "a while". No abdominal pain now.  She underwent a CT abdomen/pelvis with CM which revealed: 1. Markedly distended gallbladder, with a 2.9 cm focus of mildly  increased attenuation possibly reflecting a large stone or sludge  ball. Dilatation of the common hepatic duct to 1.5 cm in diameter,  with two obstructing stones seen distally at the level of the  pancreatic head, measuring 1.1 cm and 1.0 cm. Associated prominence  of the intrahepatic biliary ducts. ERCP would be helpful for further  evaluation and treatment.  2. Trace right pleural effusion, with associated atelectasis.  3. Mild apparent wall thickening at the distal esophagus may reflect  sequelae of gastroesophageal reflux.  4. Diffuse calcification along the abdominal aorta and its branches.  5. Scattered diverticulosis along the sigmoid colon, without  evidence of diverticulitis.   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 GERD (gastroesophageal reflux disease)        Hypothyroidism.      Hypertension.      History of bladder infection involving the left eye under care of Dr. Dingledine. She has      poor vision in this eye.      Bilateral cataract extraction.   No family history on file.  Social History:  reports that she has never smoked. She does not have any smokeless tobacco history on file. She reports that she does not drink alcohol or use illicit drugs.  Allergies: No Known Allergies  Medications: I have reviewed the patient's current medications.  Results for orders placed  during the hospital encounter of 03/15/13 (from the past 48 hour(s))  CBC WITH DIFFERENTIAL     Status: Abnormal   Collection Time    03/15/13 10:30 PM      Result Value Range   WBC 18.2 (*) 4.0 - 10.5 K/uL   RBC 3.48 (*) 3.87 - 5.11 MIL/uL   Hemoglobin 12.5  12.0 - 15.0 g/dL   HCT 36.4  36.0 - 46.0 %   MCV 104.6 (*) 78.0 - 100.0 fL   MCH 35.9 (*) 26.0 - 34.0 pg   MCHC 34.3  30.0 - 36.0 g/dL   RDW 16.9 (*) 11.5 - 15.5 %   Platelets 340  150 - 400 K/uL   Neutrophils Relative % 91 (*) 43 - 77 %   Neutro Abs 16.5 (*) 1.7 - 7.7 K/uL   Lymphocytes Relative 5 (*) 12 - 46 %   Lymphs Abs 0.8  0.7 - 4.0 K/uL   Monocytes Relative 5  3 - 12 %   Monocytes Absolute 0.9  0.1 - 1.0 K/uL   Eosinophils Relative 0  0 - 5 %   Eosinophils Absolute 0.0  0.0 - 0.7 K/uL   Basophils Relative 0  0 - 1 %   Basophils Absolute 0.0  0.0 - 0.1 K/uL  COMPREHENSIVE METABOLIC PANEL     Status: Abnormal   Collection Time    03/15/13 10:30 PM      Result Value Range   Sodium 140    137 - 147 mEq/L   Potassium 4.2  3.7 - 5.3 mEq/L   Chloride 94 (*) 96 - 112 mEq/L   CO2 29  19 - 32 mEq/L   Glucose, Bld 169 (*) 70 - 99 mg/dL   BUN 20  6 - 23 mg/dL   Creatinine, Ser 1.66 (*) 0.50 - 1.10 mg/dL   Calcium 10.1  8.4 - 10.5 mg/dL   Total Protein 8.3  6.0 - 8.3 g/dL   Albumin 3.2 (*) 3.5 - 5.2 g/dL   AST 206 (*) 0 - 37 U/L   ALT 88 (*) 0 - 35 U/L   Alkaline Phosphatase 573 (*) 39 - 117 U/L   Total Bilirubin 11.8 (*) 0.3 - 1.2 mg/dL   GFR calc non Af Amer 26 (*) >90 mL/min   GFR calc Af Amer 30 (*) >90 mL/min   Comment: (NOTE)     The eGFR has been calculated using the CKD EPI equation.     This calculation has not been validated in all clinical situations.     eGFR's persistently <90 mL/min signify possible Chronic Kidney     Disease.  LIPASE, BLOOD     Status: None   Collection Time    03/15/13 10:30 PM      Result Value Range   Lipase 27  11 - 59 U/L    Ct Abdomen Pelvis Wo Contrast  03/16/2013    CLINICAL DATA:  Nausea and upper abdominal pain.  EXAM: CT ABDOMEN AND PELVIS WITHOUT CONTRAST  TECHNIQUE: Multidetector CT imaging of the abdomen and pelvis was performed following the standard protocol without intravenous contrast.  COMPARISON:  None.  FINDINGS: A trace right pleural effusion is noted, with associated atelectasis. Mild apparent wall thickening at the distal esophagus may reflect sequelae of gastroesophageal reflux.  Scattered calcified granulomata are seen within the liver and spleen. There is prominence of the intrahepatic biliary ducts. The common hepatic duct is dilated to 1.5 cm in diameter, with two obstructing stones seen distally at the level of the pancreatic head, measuring 1.1 cm and 1.0 cm. The gallbladder is markedly distended, with a 2.9 cm focus of mildly increased attenuation possibly reflecting a sludge ball or stone.  The spleen is otherwise unremarkable in appearance. The pancreas and adrenal glands are within normal limits.  The kidneys are unremarkable in appearance. There is no evidence of hydronephrosis. No renal or ureteral stones are seen. No perinephric stranding is appreciated.  No free fluid is identified. The small bowel is unremarkable in appearance. The stomach is within normal limits. No acute vascular abnormalities are seen. Diffuse calcification is noted along the abdominal aorta and its branches.  The appendix is normal in caliber, without evidence for appendicitis. Stool is seen partially filling the colon. Scattered diverticulosis is noted along the sigmoid colon, without evidence of diverticulitis.  The bladder is mildly distended and grossly unremarkable in appearance. The patient is status post hysterectomy. The ovaries are relatively symmetric; no suspicious adnexal masses are seen. No inguinal lymphadenopathy is seen.  No acute osseous abnormalities are identified. Vacuum phenomenon is noted at L3-L4.  IMPRESSION: 1. Markedly distended gallbladder, with a  2.9 cm focus of mildly increased attenuation possibly reflecting a large stone or sludge ball. Dilatation of the common hepatic duct to 1.5 cm in diameter, with two obstructing stones seen distally at the level of the pancreatic head, measuring 1.1 cm and 1.0 cm. Associated prominence of the intrahepatic biliary ducts. ERCP would be helpful for   further evaluation and treatment. 2. Trace right pleural effusion, with associated atelectasis. 3. Mild apparent wall thickening at the distal esophagus may reflect sequelae of gastroesophageal reflux. 4. Diffuse calcification along the abdominal aorta and its branches. 5. Scattered diverticulosis along the sigmoid colon, without evidence of diverticulitis.  These results were called by telephone at the time of interpretation on 03/16/2013 at 1:57 AM to Dr. Virgel Manifold, who verbally acknowledged these results.   Electronically Signed   By: Garald Balding M.D.   On: 03/16/2013 01:57    ROS Blood pressure 92/63, pulse 67, temperature 98.6 F (37 C), temperature source Rectal, resp. rate 14, height 5' (1.524 m), weight 100 lb 8.5 oz (45.6 kg), SpO2 89.00%. Physical Exam Alert and oriented. Skin warm and dry. Oral mucosa is moist.  Skin color is yellow. . Sclera icteric, conjunctivae is pink. Thyroid not enlarged. No cervical lymphadenopathy. Lungs clear. Heart regular rate and rhythm.  Abdomen is soft. Bowel sounds are positive. No hepatomegaly. No abdominal masses felt. No tenderness.  No edema to lower extremities.    Assessment/Plan: CBD stone/sludge. Cholangitis. Will discuss with Dr. Laural Golden (ERCP) Agree with Zosyn IV  SETZER,TERRI W 03/16/2013, 8:21 AM    GI attending note; Patient interviewed and examined. Condition reviewed with 2 of her daughters and granddaughter who are known to the patient. Patient is 80 year old Caucasian female who presents with over a week history of epigastric pain, anorexia and low-grade fever. Patient has been jaundiced for one  week. She has had intermittent pain for about a year but she declined to be evaluated. Finally she agreed to come to the emergency room where she was noted to be jaundice. She had leukocytosis. CT scan revealed distended gallbladder with cholelithiasis and dilated biliary system with two large stones in distal CBD. This morning she denies abdominal pain. She also denies nausea or vomiting she states she does not have a good appetite. She also denies shortness of breath or chest pain. Abdominal exam reveals a flat soft abdomen without tenderness no megaly or masses. No LE edema noted.  Assessment; Acute cholangitis secondary to choledocholithiasis. Distended gallbladder with cholelithiasis. Patient will need ERCP with stone extraction and she possibly also need cholecystectomy once the bile duct has been cleared of stones. Procedure and risks reviewed with the patient and her family members and they're all agreeable. I also reviewed CT fills with the patient's family members. Patient's blood pressure has been fluctuating possibly related to pain. Elevated serum creatinine. It remains to be seen if renal function improves with hydration.  Recommendations; ERCP with biliary sphincterotomy and stone extraction this morning. If stones cannot be removed bile duct will be be stented. Will check INR.

## 2013-03-16 NOTE — Progress Notes (Signed)
At approximately 1345 noted patients O2 sats to be 84%. Family at bedside looked concerned from hallway. Went into room to assess patient. Patient did not respond to stimuli or name but had just recently returned from PACU. RT called and asked for BIPap and assessment. MD notified that sats were low and asked to come to ICU. No breath sounds noted upon assessment. Sats dropped to 79%. Upon attempting to count RR I noted that respirations were not sufficient with very minimal chest movement. Code Blue called. RT gave rescue breaths per ambubag. MD at bedside called out meds to give as following: Narcan 0.4mg  at 1359; Romaxicon 0.2mg  at 1400 along with another dose of Narcan 0.4mg ; Romaxicon 0.2 mg at 1409; Romaxicon at 1410; Lasix 40mg  IV at 1415. Patient became more responsive after the second dose of Narcan but was still very lethargic. Patient began breathing on her own at [id number]. RT initiated BiPap 1410. Dr. York SpanielBuriev updated the family in the waiting area.

## 2013-03-16 NOTE — Progress Notes (Signed)
UR chart review completed.  

## 2013-03-16 NOTE — Anesthesia Procedure Notes (Signed)
Procedure Name: Intubation Date/Time: 03/16/2013 10:26 AM Performed by: Pernell DupreADAMS, Erica Bialas A Pre-anesthesia Checklist: Patient identified, Patient being monitored, Timeout performed, Emergency Drugs available and Suction available Patient Re-evaluated:Patient Re-evaluated prior to inductionOxygen Delivery Method: Circle System Utilized Preoxygenation: Pre-oxygenation with 100% oxygen Intubation Type: IV induction, Rapid sequence and Cricoid Pressure applied Ventilation: Mask ventilation without difficulty Laryngoscope Size: 3 and Miller Grade View: Grade I Tube type: Oral Tube size: 7.0 mm Number of attempts: 1 Airway Equipment and Method: stylet Placement Confirmation: ETT inserted through vocal cords under direct vision,  positive ETCO2 and breath sounds checked- equal and bilateral Secured at: 21 cm Tube secured with: Tape Dental Injury: Teeth and Oropharynx as per pre-operative assessment

## 2013-03-16 NOTE — ED Notes (Signed)
MD at bedside. Dr Lama 

## 2013-03-16 NOTE — Progress Notes (Signed)
ANTIBIOTIC CONSULT NOTE - INITIAL  Pharmacy Consult for Zosyn Indication: Choledocholithiasis  No Known Allergies  Patient Measurements: Height: 5' (152.4 cm) Weight: 100 lb 8.5 oz (45.6 kg) IBW/kg (Calculated) : 45.5  Vital Signs: Temp: 98.6 F (37 C) (01/19 0446) Temp src: Rectal (01/19 0101) BP: 92/63 mmHg (01/19 0700) Pulse Rate: 67 (01/19 0700) Intake/Output from previous day:   Intake/Output from this shift:    Labs:  Recent Labs  03/15/13 2230  WBC 18.2*  HGB 12.5  PLT 340  CREATININE 1.66*   Estimated Creatinine Clearance: 16.2 ml/min (by C-G formula based on Cr of 1.66). No results found for this basename: VANCOTROUGH, VANCOPEAK, VANCORANDOM, GENTTROUGH, GENTPEAK, GENTRANDOM, TOBRATROUGH, TOBRAPEAK, TOBRARND, AMIKACINPEAK, AMIKACINTROU, AMIKACIN,  in the last 72 hours   Microbiology: No results found for this or any previous visit (from the past 720 hour(s)).  Medical History: Past Medical History  Diagnosis Date  . GERD (gastroesophageal reflux disease)     Medications:  Scheduled:  . acyclovir  800 mg Oral BID  . beta carotene w/minerals  1 tablet Oral BID  . Difluprednate  1 drop Left Eye Daily  . enalapril  20 mg Oral Daily  . enoxaparin (LOVENOX) injection  40 mg Subcutaneous Q24H  . levothyroxine  50 mcg Oral QAC breakfast  . timolol  1 drop Right Eye Daily   Assessment: 80 yo F admitted with choledocholithiasis.  She was empirically started on Zosyn for elevated WBC.   Acute kidney injury likely from dehydration per MD note, however current estimated CrCl < 20 ml/min.    Zosyn 1/19>>  Goal of Therapy:  Eradicate infection.  Plan:  Zosyn 2.25gm IV q8h Monitor renal function and cx data   Elson ClanLilliston, Zanylah Hardie Michelle 03/16/2013,7:45 AM

## 2013-03-16 NOTE — Anesthesia Postprocedure Evaluation (Signed)
  Anesthesia Post-op Note  Patient: Erica Dunn  Procedure(s) Performed: Procedure(s): ENDOSCOPIC RETROGRADE CHOLANGIOPANCREATOGRAPHY (ERCP) (Duct not cleared of all stones) SPHINCTEROTOMY BALLOON DILATION REMOVAL OF STONES WITH STONE BASKET BILIARY STENT PLACEMENT  Patient Location: PACU  Anesthesia Type:General  Level of Consciousness: awake, alert  and patient cooperative  Airway and Oxygen Therapy: Patient Spontanous Breathing  Post-op Pain: none  Post-op Assessment: Post-op Vital signs reviewed, Patient's Cardiovascular Status Stable, Respiratory Function Stable, Patent Airway, No signs of Nausea or vomiting and Pain level controlled  Post-op Vital Signs: Reviewed and stable  Complications: No apparent anesthesia complications

## 2013-03-16 NOTE — H&P (Addendum)
PCP:   Renee Rival, MD   Chief Complaint:  Abdominal pain  HPI: 80 year old female with history of GERD came to the hospital with worsening abdominal pain for past one week. Pain has been intermittent but has been worsening over the past one day she also had nausea but no vomiting she denies fever. Denies diarrhea or constipation. Patient's skin color change to yellow core week ago. She denies dysuria urgency frequency of urination. In the ED CT scan of the abdomen and pelvis was done which showed choledocholithiasis.  Allergies:  No Known Allergies    Past Medical History  Diagnosis Date  . GERD (gastroesophageal reflux disease)     History reviewed. No pertinent past surgical history.  Prior to Admission medications   Medication Sig Start Date End Date Taking? Authorizing Provider  acyclovir (ZOVIRAX) 800 MG tablet Take 800 mg by mouth 2 (two) times daily.   Yes Historical Provider, MD  beta carotene w/minerals (OCUVITE) tablet Take 1 tablet by mouth 2 (two) times daily.   Yes Historical Provider, MD  Calcium Carbonate-Vitamin D (CALCIUM 600+D) 600-400 MG-UNIT per tablet Take 1 tablet by mouth 2 (two) times daily.   Yes Historical Provider, MD  cyanocobalamin (,VITAMIN B-12,) 1000 MCG/ML injection Inject 1,000 mcg into the muscle every 30 (thirty) days.   Yes Historical Provider, MD  Difluprednate (DUREZOL) 0.05 % EMUL Place 1 drop into the left eye daily.   Yes Historical Provider, MD  enalapril (VASOTEC) 20 MG tablet Take 20 mg by mouth daily.   Yes Historical Provider, MD  furosemide (LASIX) 20 MG tablet Take 20 mg by mouth daily.   Yes Historical Provider, MD  levothyroxine (SYNTHROID, LEVOTHROID) 50 MCG tablet Take 50 mcg by mouth daily before breakfast.   Yes Historical Provider, MD  omeprazole (PRILOSEC) 20 MG capsule Take 20 mg by mouth 2 (two) times daily.   Yes Historical Provider, MD  timolol (TIMOPTIC) 0.5 % ophthalmic solution Place 1 drop into the right eye  daily.   Yes Historical Provider, MD  Vitamin D, Ergocalciferol, (DRISDOL) 50000 UNITS CAPS capsule Take 50,000 Units by mouth every 30 (thirty) days.   Yes Historical Provider, MD    Social History:  reports that she has never smoked. She does not have any smokeless tobacco history on file. She reports that she does not drink alcohol or use illicit drugs.  No family history on file.   All the positives are listed in BOLD  Review of Systems:  HEENT: Headache, blurred vision, runny nose, sore throat Neck: Hypothyroidism, hyperthyroidism,,lymphadenopathy Chest : Shortness of breath, history of COPD, Asthma Heart : Chest pain, history of coronary arterey disease GI:  Nausea, vomiting, diarrhea, constipation, GERD GU: Dysuria, urgency, frequency of urination, hematuria Neuro: Stroke, seizures, syncope Psych: Depression, anxiety, hallucinations   Physical Exam: Blood pressure 184/85, pulse 99, temperature 99.5 F (37.5 C), temperature source Rectal, resp. rate 18, weight 45.36 kg (100 lb), SpO2 91.00%. Constitutional:   Patient is a well-developed and well-nourished *female in no acute distress and cooperative with exam. Head: Normocephalic and atraumatic Mouth: Mucus membranes moist Eyes: PERRL, EOMI, conjunctivae normal Neck: Supple, No Thyromegaly Cardiovascular: RRR, S1 normal, S2 normal Pulmonary/Chest: CTAB, no wheezes, rales, or rhonchi Abdominal: Soft. Positive epigastric tenderness , non-distended, bowel sounds are normal, no masses, organomegaly, or guarding present.  Neurological: A&O x3, Strenght is normal and symmetric bilaterally, cranial nerve II-XII are grossly intact, no focal motor deficit, sensory intact to light touch bilaterally.  Extremities :  No Cyanosis, Clubbing or Edema   Labs on Admission:  Results for orders placed during the hospital encounter of 03/15/13 (from the past 48 hour(s))  CBC WITH DIFFERENTIAL     Status: Abnormal   Collection Time     03/15/13 10:30 PM      Result Value Range   WBC 18.2 (*) 4.0 - 10.5 K/uL   RBC 3.48 (*) 3.87 - 5.11 MIL/uL   Hemoglobin 12.5  12.0 - 15.0 g/dL   HCT 36.4  36.0 - 46.0 %   MCV 104.6 (*) 78.0 - 100.0 fL   MCH 35.9 (*) 26.0 - 34.0 pg   MCHC 34.3  30.0 - 36.0 g/dL   RDW 16.9 (*) 11.5 - 15.5 %   Platelets 340  150 - 400 K/uL   Neutrophils Relative % 91 (*) 43 - 77 %   Neutro Abs 16.5 (*) 1.7 - 7.7 K/uL   Lymphocytes Relative 5 (*) 12 - 46 %   Lymphs Abs 0.8  0.7 - 4.0 K/uL   Monocytes Relative 5  3 - 12 %   Monocytes Absolute 0.9  0.1 - 1.0 K/uL   Eosinophils Relative 0  0 - 5 %   Eosinophils Absolute 0.0  0.0 - 0.7 K/uL   Basophils Relative 0  0 - 1 %   Basophils Absolute 0.0  0.0 - 0.1 K/uL  COMPREHENSIVE METABOLIC PANEL     Status: Abnormal   Collection Time    03/15/13 10:30 PM      Result Value Range   Sodium 140  137 - 147 mEq/L   Potassium 4.2  3.7 - 5.3 mEq/L   Chloride 94 (*) 96 - 112 mEq/L   CO2 29  19 - 32 mEq/L   Glucose, Bld 169 (*) 70 - 99 mg/dL   BUN 20  6 - 23 mg/dL   Creatinine, Ser 1.66 (*) 0.50 - 1.10 mg/dL   Calcium 10.1  8.4 - 10.5 mg/dL   Total Protein 8.3  6.0 - 8.3 g/dL   Albumin 3.2 (*) 3.5 - 5.2 g/dL   AST 206 (*) 0 - 37 U/L   ALT 88 (*) 0 - 35 U/L   Alkaline Phosphatase 573 (*) 39 - 117 U/L   Total Bilirubin 11.8 (*) 0.3 - 1.2 mg/dL   GFR calc non Af Amer 26 (*) >90 mL/min   GFR calc Af Amer 30 (*) >90 mL/min   Comment: (NOTE)     The eGFR has been calculated using the CKD EPI equation.     This calculation has not been validated in all clinical situations.     eGFR's persistently <90 mL/min signify possible Chronic Kidney     Disease.  LIPASE, BLOOD     Status: None   Collection Time    03/15/13 10:30 PM      Result Value Range   Lipase 27  11 - 59 U/L    Radiological Exams on Admission: Ct Abdomen Pelvis Wo Contrast  03/16/2013   CLINICAL DATA:  Nausea and upper abdominal pain.  EXAM: CT ABDOMEN AND PELVIS WITHOUT CONTRAST  TECHNIQUE:  Multidetector CT imaging of the abdomen and pelvis was performed following the standard protocol without intravenous contrast.  COMPARISON:  None.  FINDINGS: A trace right pleural effusion is noted, with associated atelectasis. Mild apparent wall thickening at the distal esophagus may reflect sequelae of gastroesophageal reflux.  Scattered calcified granulomata are seen within the liver and spleen. There is prominence of the  intrahepatic biliary ducts. The common hepatic duct is dilated to 1.5 cm in diameter, with two obstructing stones seen distally at the level of the pancreatic head, measuring 1.1 cm and 1.0 cm. The gallbladder is markedly distended, with a 2.9 cm focus of mildly increased attenuation possibly reflecting a sludge ball or stone.  The spleen is otherwise unremarkable in appearance. The pancreas and adrenal glands are within normal limits.  The kidneys are unremarkable in appearance. There is no evidence of hydronephrosis. No renal or ureteral stones are seen. No perinephric stranding is appreciated.  No free fluid is identified. The small bowel is unremarkable in appearance. The stomach is within normal limits. No acute vascular abnormalities are seen. Diffuse calcification is noted along the abdominal aorta and its branches.  The appendix is normal in caliber, without evidence for appendicitis. Stool is seen partially filling the colon. Scattered diverticulosis is noted along the sigmoid colon, without evidence of diverticulitis.  The bladder is mildly distended and grossly unremarkable in appearance. The patient is status post hysterectomy. The ovaries are relatively symmetric; no suspicious adnexal masses are seen. No inguinal lymphadenopathy is seen.  No acute osseous abnormalities are identified. Vacuum phenomenon is noted at L3-L4.  IMPRESSION: 1. Markedly distended gallbladder, with a 2.9 cm focus of mildly increased attenuation possibly reflecting a large stone or sludge ball. Dilatation of  the common hepatic duct to 1.5 cm in diameter, with two obstructing stones seen distally at the level of the pancreatic head, measuring 1.1 cm and 1.0 cm. Associated prominence of the intrahepatic biliary ducts. ERCP would be helpful for further evaluation and treatment. 2. Trace right pleural effusion, with associated atelectasis. 3. Mild apparent wall thickening at the distal esophagus may reflect sequelae of gastroesophageal reflux. 4. Diffuse calcification along the abdominal aorta and its branches. 5. Scattered diverticulosis along the sigmoid colon, without evidence of diverticulitis.  These results were called by telephone at the time of interpretation on 03/16/2013 at 1:57 AM to Dr. Virgel Manifold, who verbally acknowledged these results.   Electronically Signed   By: Garald Balding M.D.   On: 03/16/2013 01:57    Assessment/Plan Active Problems:   Choledocholithiasis with obstruction   Choledocholithiasis   HTN (hypertension)  Choledocholithiasis with obstruction Patient has dilation of common hepatic duct to 1.5 cm with 2 obstructing stones seen distally at the level of pancreatic head. GI has been consulted for possible ERCP in a.m. Patient has been started on Zosyn empirically and she has elevated white count. We'll continue with Zosyn per pharmacy consultation.  Obstructive jaundice Secondary to above, patient's bilirubin is 11.8 with alkaline phosphatase 573. We'll continue to monitor the labs. ERCP in a.m.  Hypertension Patient pressure is elevated, started on hydralazine 25 mg by mouth every 6 hours when necessary for BP greater than 160/100.  Acute kidney injury Likely due to diuretics and dehydration Patient's creatinine is 1.66, will start IV normal saline, hold Lasix and check BMP in the morning.  DVT prophylaxis Lovenox  Code status: Presumed code  Family discussion: Discussed with patient in detail, no family at bedside   Time Spent on Admission: 57  minutes  Cindi Ghazarian S [facility] Pager: [phone] 03/16/2013, 2:48 AM  If 7PM-7AM, please contact night-coverage  [email or web address]  Password [email or web address]

## 2013-03-16 NOTE — Progress Notes (Addendum)
[facility] PROGRESS NOTE  Erica Dunn ZOX:[id number] DOB: 09/27/1920 DOA: 03/15/2013 PCP: Erasmo DownerStrader, Lindsey F, MD  Code blue called  Patient was unresponsive, hypoxic with decreased respiration; hemodynamically stable, pulse is palpable -narcan, flumazenil is given to reverse possible opioid/benzo overdose peri-procedure; patient started on BiPAP maintaining oxygenation at this time; exam BL LE crackles, +S3;  -will give lasix 40mg , obtain ECG, echo, CXR. ABG, cont BiPAP; cont supportive care   D/w updated family, daughters, grandchildren, friends; patient is DNR  Esperanza SheetsBURIEV, [name]  [facility] Pager [phone]. If 7PM-7AM, please contact night-coverage at [email or web address], password [email or web address] 03/16/2013, 2:28 PM  LOS: 1 day

## 2013-03-16 NOTE — Progress Notes (Signed)
[facility] PROGRESS NOTE  Erica Dunn FBP:[id number] DOB: 1920/11/17 DOA: 03/15/2013 PCP: Renee Rival, MD  Assessment/Plan: 80 year old female with history of , HTN, hypothyroidism, GERD came to the hospital with worsening abdominal pain for past one week found to have cholelithiasis, sepsis   1. Sepsis/hypotension/possible cholangitis; CT: distended gallbladder, hepatic duct, cholelithiasis  -cont IV atx, IVF, antiemetics, NPO; pend GI eval for possible ERCP   2. Leukocytosis likely due to above; cont IV atx; obtain blood c/s;   3. Possible AKI vs chronic  CKD -IVF, monitor renal function   4. Abnormal LFTs, alk phos due to #1; cont as above   5. HTN currently hypotensive, hold ACE; prn hydralazine   6. Hypothyroidism, cont levothyroxine; check tsh    Code Status: full Family Communication: d/w patient (indicate person spoken with, relationship, and if by phone, the number) Disposition Plan: home when ready    Consultants:  GI  Procedures:  Pend ? ERCP  Antibiotics:  Zosyn 1/19<<<   (indicate start date, and stop date if known)  HPI/Subjective: alert  Objective: Filed Vitals:   03/16/13 0700  BP: 92/63  Pulse: 67  Temp:   Resp: 14   No intake or output data in the 24 hours ending 03/16/13 0828 Filed Weights   03/15/13 2145 03/16/13 0446  Weight: 45.36 kg (100 lb) 45.6 kg (100 lb 8.5 oz)    Exam:   General:  alert  Cardiovascular: s1,s2 rrr  Respiratory: LL crackles few   Abdomen: soft, nt, nd   Musculoskeletal: no LE edema   Data Reviewed: Basic Metabolic Panel:  Recent Labs Lab 03/15/13 2230  NA 140  K 4.2  CL 94*  CO2 29  GLUCOSE 169*  BUN 20  CREATININE 1.66*  CALCIUM 10.1   Liver Function Tests:  Recent Labs Lab 03/15/13 2230  AST 206*  ALT 88*  ALKPHOS 573*  BILITOT 11.8*  PROT 8.3  ALBUMIN 3.2*    Recent Labs Lab 03/15/13 2230  LIPASE 27   No results found for this basename: AMMONIA,  in the  last 168 hours CBC:  Recent Labs Lab 03/15/13 2230  WBC 18.2*  NEUTROABS 16.5*  HGB 12.5  HCT 36.4  MCV 104.6*  PLT 340   Cardiac Enzymes: No results found for this basename: CKTOTAL, CKMB, CKMBINDEX, TROPONINI,  in the last 168 hours BNP (last 3 results) No results found for this basename: PROBNP,  in the last 8760 hours CBG: No results found for this basename: GLUCAP,  in the last 168 hours  No results found for this or any previous visit (from the past 240 hour(s)).   Studies: Ct Abdomen Pelvis Wo Contrast  03/16/2013   CLINICAL DATA:  Nausea and upper abdominal pain.  EXAM: CT ABDOMEN AND PELVIS WITHOUT CONTRAST  TECHNIQUE: Multidetector CT imaging of the abdomen and pelvis was performed following the standard protocol without intravenous contrast.  COMPARISON:  None.  FINDINGS: A trace right pleural effusion is noted, with associated atelectasis. Mild apparent wall thickening at the distal esophagus may reflect sequelae of gastroesophageal reflux.  Scattered calcified granulomata are seen within the liver and spleen. There is prominence of the intrahepatic biliary ducts. The common hepatic duct is dilated to 1.5 cm in diameter, with two obstructing stones seen distally at the level of the pancreatic head, measuring 1.1 cm and 1.0 cm. The gallbladder is markedly distended, with a 2.9 cm focus of mildly increased attenuation possibly reflecting a sludge ball or stone.  The  spleen is otherwise unremarkable in appearance. The pancreas and adrenal glands are within normal limits.  The kidneys are unremarkable in appearance. There is no evidence of hydronephrosis. No renal or ureteral stones are seen. No perinephric stranding is appreciated.  No free fluid is identified. The small bowel is unremarkable in appearance. The stomach is within normal limits. No acute vascular abnormalities are seen. Diffuse calcification is noted along the abdominal aorta and its branches.  The appendix is normal  in caliber, without evidence for appendicitis. Stool is seen partially filling the colon. Scattered diverticulosis is noted along the sigmoid colon, without evidence of diverticulitis.  The bladder is mildly distended and grossly unremarkable in appearance. The patient is status post hysterectomy. The ovaries are relatively symmetric; no suspicious adnexal masses are seen. No inguinal lymphadenopathy is seen.  No acute osseous abnormalities are identified. Vacuum phenomenon is noted at L3-L4.  IMPRESSION: 1. Markedly distended gallbladder, with a 2.9 cm focus of mildly increased attenuation possibly reflecting a large stone or sludge ball. Dilatation of the common hepatic duct to 1.5 cm in diameter, with two obstructing stones seen distally at the level of the pancreatic head, measuring 1.1 cm and 1.0 cm. Associated prominence of the intrahepatic biliary ducts. ERCP would be helpful for further evaluation and treatment. 2. Trace right pleural effusion, with associated atelectasis. 3. Mild apparent wall thickening at the distal esophagus may reflect sequelae of gastroesophageal reflux. 4. Diffuse calcification along the abdominal aorta and its branches. 5. Scattered diverticulosis along the sigmoid colon, without evidence of diverticulitis.  These results were called by telephone at the time of interpretation on 03/16/2013 at 1:57 AM to Dr. Virgel Manifold, who verbally acknowledged these results.   Electronically Signed   By: Garald Balding M.D.   On: 03/16/2013 01:57    Scheduled Meds: . acyclovir  800 mg Oral BID  . beta carotene w/minerals  1 tablet Oral BID  . Difluprednate  1 drop Left Eye Daily  . enalapril  20 mg Oral Daily  . levothyroxine  50 mcg Oral QAC breakfast  . piperacillin-tazobactam (ZOSYN)  IV  2.25 g Intravenous Q8H  . timolol  1 drop Right Eye Daily   Continuous Infusions: . sodium chloride Stopped (03/16/13 0332)    Active Problems:   Choledocholithiasis with obstruction    Choledocholithiasis   HTN (hypertension)    Time spent: >35 minutes     Kinnie Feil  [facility] Pager [phone]. If 7PM-7AM, please contact night-coverage at [email or web address], password [facility] 03/16/2013, 8:28 AM  LOS: 1 day

## 2013-03-16 NOTE — Preoperative (Signed)
Beta Blockers   Reason not to administer Beta Blockers:Not Applicable 

## 2013-03-17 DIAGNOSIS — I369 Nonrheumatic tricuspid valve disorder, unspecified: Secondary | ICD-10-CM

## 2013-03-17 DIAGNOSIS — N289 Disorder of kidney and ureter, unspecified: Secondary | ICD-10-CM

## 2013-03-17 DIAGNOSIS — I10 Essential (primary) hypertension: Secondary | ICD-10-CM

## 2013-03-17 DIAGNOSIS — K8050 Calculus of bile duct without cholangitis or cholecystitis without obstruction: Secondary | ICD-10-CM

## 2013-03-17 LAB — IRON AND TIBC
Iron: 67 ug/dL (ref 42–135)
Saturation Ratios: 42 % (ref 20–55)
TIBC: 161 ug/dL — ABNORMAL LOW (ref 250–470)
UIBC: 94 ug/dL — ABNORMAL LOW (ref 125–400)

## 2013-03-17 LAB — COMPREHENSIVE METABOLIC PANEL
ALBUMIN: 1.8 g/dL — AB (ref 3.5–5.2)
ALT: 72 U/L — AB (ref 0–35)
AST: 223 U/L — ABNORMAL HIGH (ref 0–37)
Alkaline Phosphatase: 289 U/L — ABNORMAL HIGH (ref 39–117)
BUN: 25 mg/dL — AB (ref 6–23)
CO2: 26 mEq/L (ref 19–32)
Calcium: 7.9 mg/dL — ABNORMAL LOW (ref 8.4–10.5)
Chloride: 106 mEq/L (ref 96–112)
Creatinine, Ser: 1.65 mg/dL — ABNORMAL HIGH (ref 0.50–1.10)
GFR calc Af Amer: 30 mL/min — ABNORMAL LOW (ref >90)
GFR calc non Af Amer: 26 mL/min — ABNORMAL LOW (ref >90)
Glucose, Bld: 79 mg/dL (ref 70–99)
POTASSIUM: 3.8 meq/L (ref 3.7–5.3)
SODIUM: 144 meq/L (ref 137–147)
TOTAL PROTEIN: 5.1 g/dL — AB (ref 6.0–8.3)
Total Bilirubin: 5.9 mg/dL — ABNORMAL HIGH (ref 0.3–1.2)

## 2013-03-17 LAB — VITAMIN B12: Vitamin B-12: 1130 pg/mL — ABNORMAL HIGH (ref 211–911)

## 2013-03-17 LAB — CBC
HCT: 26.3 % — ABNORMAL LOW (ref 36.0–46.0)
Hemoglobin: 8.7 g/dL — ABNORMAL LOW (ref 12.0–15.0)
MCH: 34.9 pg — ABNORMAL HIGH (ref 26.0–34.0)
MCHC: 33.1 g/dL (ref 30.0–36.0)
MCV: 105.6 fL — ABNORMAL HIGH (ref 78.0–100.0)
PLATELETS: 182 10*3/uL (ref 150–400)
RBC: 2.49 MIL/uL — ABNORMAL LOW (ref 3.87–5.11)
RDW: 17.4 % — AB (ref 11.5–15.5)
WBC: 11.8 10*3/uL — AB (ref 4.0–10.5)

## 2013-03-17 LAB — AMYLASE: AMYLASE: 28 U/L (ref 0–105)

## 2013-03-17 LAB — GLUCOSE, CAPILLARY: GLUCOSE-CAPILLARY: 109 mg/dL — AB (ref 70–99)

## 2013-03-17 LAB — TROPONIN I
Troponin I: 0.38 ng/mL (ref <0.30)
Troponin I: <0.3 ng/mL (ref <0.30)

## 2013-03-17 LAB — FERRITIN: FERRITIN: 705 ng/mL — AB (ref 10–291)

## 2013-03-17 MED ORDER — FUROSEMIDE 10 MG/ML IJ SOLN
20.0000 mg | Freq: Once | INTRAMUSCULAR | Status: AC
  Administered 2013-03-17: 20 mg via INTRAVENOUS
  Filled 2013-03-17: qty 2

## 2013-03-17 NOTE — Progress Notes (Signed)
CRITICAL VALUE ALERT  Critical value received:  Trop 0.38  Date of notification:  03/17/2013  Time of notification:  1020  Critical value read back:yes  Nurse who received alert:  Kathyrn SheriffJessica Rayni Nemitz, RN  MD notified (1st page):  Dr. York SpanielBuriev  Time of first page:  1025  MD notified (2nd page):  Time of second page:  Responding MD:  Dr. York SpanielBuriev  Time MD responded:  1027

## 2013-03-17 NOTE — Progress Notes (Addendum)
[facility] PROGRESS NOTE  Erica Dunn ALP:[id number] DOB: 17-Jan-1921 DOA: 03/15/2013 PCP: Renee Rival, MD  Assessment/Plan: 80 year old female with history of , HTN, hypothyroidism, GERD came to the hospital with worsening abdominal pain for past one week found to have cholelithiasis, cholangitis, sepsis s/p ERCP developed respiratory failure resolved on BiPAP   1. Sepsis/hypotension/cholangitis; CT: distended gallbladder, hepatic duct, cholelithiasis  -s/p ERCP 1/19: stent laserbiliary decompression -cont IV atx, antiemetics, appreciate, GI, surgery eval; diet per GI  2. Leukocytosis likely due to above; cont IV atx; blood c/s: NGTD  3. Acute respiratory failure multifactorial due to opioid/benzo +CHF; code blue called on 1/19; patient in respiratory distress, but hemodynamically stable;  -resolved on BiPAP/naloxone/flumazenil+lasix IV; cont prn bronchodilators, oxygen, pend echo  4. Acute CHF; unclear history; CRX: pulmonary edema, S3;  -improving on IV lasix; pend echo; cont diuresis prn as BP allows; expect worsening renal function  -mild positive trop possible stress related vs CKD; f/u troponin's   5. Abnormal LFTs, alk phos due to #1; improving; cont as above   6. HTN currently hypotensive, hold ACE; prn hydralazine   7. Hypothyroidism, cont levothyroxine; check tsh   8. Acute anemia r/o blood loss; check occult blood; Tf prn;    Code Status: DNR Family Communication: d/w patient, updated family at the bedside, son (indicate person spoken with, relationship, and if by phone, the number) Disposition Plan: home when ready    Consultants:  GI  Procedures:  Pend ? ERCP  Antibiotics:  Zosyn 1/19<<<   (indicate start date, and stop date if known)  HPI/Subjective: alert  Objective: Filed Vitals:   03/17/13 0500  BP: 91/43  Pulse: 57  Temp:   Resp: 15    Intake/Output Summary (Last 24 hours) at 03/17/13 0818 Last data filed at 03/17/13 0500   Gross per 24 hour  Intake   1842 ml  Output    825 ml  Net   1017 ml   Filed Weights   03/16/13 0446 03/16/13 0944 03/17/13 0500  Weight: 45.6 kg (100 lb 8.5 oz) 48.535 kg (107 lb) 48.9 kg (107 lb 12.9 oz)    Exam:   General:  alert  Cardiovascular: s1,s2 rrr  Respiratory: LL crackles few   Abdomen: soft, nt, nd   Musculoskeletal: no LE edema   Data Reviewed: Basic Metabolic Panel:  Recent Labs Lab 03/15/13 2230 03/16/13 0916 03/17/13 0457  NA 140 142 144  K 4.2 3.9 3.8  CL 94* 101 106  CO2 '29 26 26  ' GLUCOSE 169* 134* 79  BUN 20 21 25*  CREATININE 1.66* 1.55* 1.65*  CALCIUM 10.1 8.5 7.9*   Liver Function Tests:  Recent Labs Lab 03/15/13 2230 03/16/13 0916 03/17/13 0457  AST 206* 157* 223*  ALT 88* 68* 72*  ALKPHOS 573* 438* 289*  BILITOT 11.8* 10.7* 5.9*  PROT 8.3 6.8 5.1*  ALBUMIN 3.2* 2.4* 1.8*    Recent Labs Lab 03/15/13 2230 03/17/13 0437  LIPASE 27  --   AMYLASE  --  28   No results found for this basename: AMMONIA,  in the last 168 hours CBC:  Recent Labs Lab 03/15/13 2230 03/16/13 0916 03/17/13 0437  WBC 18.2* 20.5* 11.8*  NEUTROABS 16.5*  --   --   HGB 12.5 10.8* 8.7*  HCT 36.4 31.9* 26.3*  MCV 104.6* 105.3* 105.6*  PLT 340 261 182   Cardiac Enzymes: No results found for this basename: CKTOTAL, CKMB, CKMBINDEX, TROPONINI,  in the last 168 hours BNP (  last 3 results)  Recent Labs  03/16/13 0900  PROBNP 7350.0*   CBG: No results found for this basename: GLUCAP,  in the last 168 hours  Recent Results (from the past 240 hour(s))  CULTURE, BLOOD (ROUTINE X 2)     Status: None   Collection Time    03/16/13  9:16 AM      Result Value Range Status   Specimen Description Blood   Final   Special Requests NONE   Final   Culture NO GROWTH <24 HRS   Final   Report Status PENDING   Incomplete  CULTURE, BLOOD (ROUTINE X 2)     Status: None   Collection Time    03/16/13  9:16 AM      Result Value Range Status   Specimen  Description Blood   Final   Special Requests NONE   Final   Culture NO GROWTH <24 HRS   Final   Report Status PENDING   Incomplete  SURGICAL PCR SCREEN     Status: None   Collection Time    03/16/13  9:27 AM      Result Value Range Status   MRSA, PCR NEGATIVE  NEGATIVE Final   Staphylococcus aureus NEGATIVE  NEGATIVE Final   Comment:            The Xpert SA Assay (FDA     approved for NASAL specimens     in patients over 68 years of age),     is one component of     a comprehensive surveillance     program.  Test performance has     been validated by [employer] for patients greater     than or equal to 82 year old.     It is not intended     to diagnose infection nor to     guide or monitor treatment.     Studies: Ct Abdomen Pelvis Wo Contrast  03/16/2013   CLINICAL DATA:  Nausea and upper abdominal pain.  EXAM: CT ABDOMEN AND PELVIS WITHOUT CONTRAST  TECHNIQUE: Multidetector CT imaging of the abdomen and pelvis was performed following the standard protocol without intravenous contrast.  COMPARISON:  None.  FINDINGS: A trace right pleural effusion is noted, with associated atelectasis. Mild apparent wall thickening at the distal esophagus may reflect sequelae of gastroesophageal reflux.  Scattered calcified granulomata are seen within the liver and spleen. There is prominence of the intrahepatic biliary ducts. The common hepatic duct is dilated to 1.5 cm in diameter, with two obstructing stones seen distally at the level of the pancreatic head, measuring 1.1 cm and 1.0 cm. The gallbladder is markedly distended, with a 2.9 cm focus of mildly increased attenuation possibly reflecting a sludge ball or stone.  The spleen is otherwise unremarkable in appearance. The pancreas and adrenal glands are within normal limits.  The kidneys are unremarkable in appearance. There is no evidence of hydronephrosis. No renal or ureteral stones are seen. No perinephric stranding is appreciated.  No free  fluid is identified. The small bowel is unremarkable in appearance. The stomach is within normal limits. No acute vascular abnormalities are seen. Diffuse calcification is noted along the abdominal aorta and its branches.  The appendix is normal in caliber, without evidence for appendicitis. Stool is seen partially filling the colon. Scattered diverticulosis is noted along the sigmoid colon, without evidence of diverticulitis.  The bladder is mildly distended and grossly unremarkable in appearance. The patient  is status post hysterectomy. The ovaries are relatively symmetric; no suspicious adnexal masses are seen. No inguinal lymphadenopathy is seen.  No acute osseous abnormalities are identified. Vacuum phenomenon is noted at L3-L4.  IMPRESSION: 1. Markedly distended gallbladder, with a 2.9 cm focus of mildly increased attenuation possibly reflecting a large stone or sludge ball. Dilatation of the common hepatic duct to 1.5 cm in diameter, with two obstructing stones seen distally at the level of the pancreatic head, measuring 1.1 cm and 1.0 cm. Associated prominence of the intrahepatic biliary ducts. ERCP would be helpful for further evaluation and treatment. 2. Trace right pleural effusion, with associated atelectasis. 3. Mild apparent wall thickening at the distal esophagus may reflect sequelae of gastroesophageal reflux. 4. Diffuse calcification along the abdominal aorta and its branches. 5. Scattered diverticulosis along the sigmoid colon, without evidence of diverticulitis.  These results were called by telephone at the time of interpretation on 03/16/2013 at 1:57 AM to Dr. Virgel Manifold, who verbally acknowledged these results.   Electronically Signed   By: Garald Balding M.D.   On: 03/16/2013 01:57   Dg Chest Port 1 View  03/16/2013   CLINICAL DATA:  Acute respiratory arrest.  EXAM: PORTABLE CHEST - 1 VIEW  COMPARISON:  None.  FINDINGS: Heart size is prominent. Diffuse interstitial infiltrates are seen  suspicious for interstitial edema. Probable small layering right pleural effusion also noted. No evidence of pulmonary consolidation.  IMPRESSION: Suspect diffuse interstitial edema and small right pleural effusion.   Electronically Signed   By: Earle Gell M.D.   On: 03/16/2013 14:41   Dg Ercp Biliary & Pancreatic Ducts  03/16/2013   CLINICAL DATA:  Choledocholithiasis  EXAM: ERCP  TECHNIQUE: Multiple spot images obtained with the fluoroscopic device and submitted for interpretation post-procedure.  COMPARISON:  CT scan 03/16/2013  FINDINGS: Multiple fluoroscopic spot images from an ERCP demonstrate a dilated common bile duct with multiple large stones. Subsequent sphincterotomy and balloon passage. The stones remain in the distal common bile duct.  Mild narrowing near the confluence of the hepatic ducts is likely due to compression by a dilated gallbladder which correlates with the CT finding.  IMPRESSION: Choledocholithiasiswith sphincterotomy, balloon passage and plastic stent placement. Common bowel duct stones remain.  Mild narrowing of the common hepatic duct likely due to a dilated gallbladder.  These images were submitted for radiologic interpretation only. Please see the procedural report for the amount of contrast and the fluoroscopy time utilized.   Electronically Signed   By: Kalman Jewels M.D.   On: 03/16/2013 12:21    Scheduled Meds: . acyclovir  800 mg Oral BID  . beta carotene w/minerals  1 tablet Oral BID  . levothyroxine  50 mcg Oral QAC breakfast  . piperacillin-tazobactam (ZOSYN)  IV  2.25 g Intravenous Q8H  . prednisoLONE acetate  1 drop Left Eye Daily  . timolol  1 drop Right Eye Daily   Continuous Infusions: . sodium chloride 75 mL/hr at 03/17/13 0230    Active Problems:   Choledocholithiasis with obstruction   Choledocholithiasis   HTN (hypertension)    Time spent: >35 minutes     Kinnie Feil  [facility] Pager [phone]. If 7PM-7AM, please contact  night-coverage at [email or web address], password [facility] 03/17/2013, 8:18 AM  LOS: 2 days

## 2013-03-17 NOTE — Progress Notes (Signed)
*  PRELIMINARY RESULTS* Echocardiogram 2D Echocardiogram has been performed.  Erica Dunn 03/17/2013, 4:15 PM

## 2013-03-17 NOTE — Discharge Instructions (Signed)
Analgesic Nephropathy  An analgesic is a medicine used to relieve pain. Over-the-counter analgesics (medicines bought without a prescription) include:  Aspirin.  Acetaminophen.  Ibuprofen.  Naproxen. These drugs present no danger for most people when taken in the recommended dose. Some conditions make taking common pain killers dangerous for the kidneys. Taking 1 or a combination of these drugs regularly, over a long period of time, may increase the risk for kidney problems.  CAUSES  Analgesic use has been linked to 2 different forms of kidney damage.  Some reports have linked acute kidney failure to the use of over-the-counter pain killers. These cases involved a single dose in some instances. Generally, the short-term analgesic use was for no more than 10 days. Drugs in this study include:  Aspirin.  Ibuprofen.  Naproxen.  The patients in these reports had risk factors, such as:  Systemic lupus erythematosus.  Advanced age.  Chronic kidney disease.  Recent heavy alcohol use.  A second form of kidney damage can result from taking pain killers every day for several years. It gradually leads to kidney failure. This causes a permanent need for dialysis or a kidney transplant.  Daily use of 2 kinds of pain killers (particularly aspirin and acetaminophen together) with caffeine or codeine are most likely to damage the kidneys. These mixtures are often sold as powders or tablets.  Acute kidney failure requires emergency dialysis to clean the blood. Kidney damage is often reversible. Normal kidney function returns after the emergency is over and the analgesic use is stopped. HOME CARE INSTRUCTIONS  Patients with conditions that put them at risk for acute kidney failure should check with their caregivers before taking analgesic medicine. People who take over-the-counter pain killers on a regular basis should check with their caregiver. Your caregiver may be able to recommend a safer  alternative. FOR MORE INFORMATION NIDDK: CheatPrevention.com.auwww2.niddk.nih.gov Document Released: 10/14/2003 Document Revised: 05/07/2011 Document Reviewed: 09/15/2007 [facility] Patient Information 2014 [place], [place].  Analgesic Nephropathy  An analgesic is a medicine used to relieve pain. Over-the-counter analgesics (medicines bought without a prescription) include:  Aspirin.  Acetaminophen.  Ibuprofen.  Naproxen. These drugs present no danger for most people when taken in the recommended dose. Some conditions make taking common pain killers dangerous for the kidneys. Taking 1 or a combination of these drugs regularly, over a long period of time, may increase the risk for kidney problems.  CAUSES  Analgesic use has been linked to 2 different forms of kidney damage.  Some reports have linked acute kidney failure to the use of over-the-counter pain killers. These cases involved a single dose in some instances. Generally, the short-term analgesic use was for no more than 10 days. Drugs in this study include:  Aspirin.  Ibuprofen.  Naproxen.  The patients in these reports had risk factors, such as:  Systemic lupus erythematosus.  Advanced age.  Chronic kidney disease.  Recent heavy alcohol use.  A second form of kidney damage can result from taking pain killers every day for several years. It gradually leads to kidney failure. This causes a permanent need for dialysis or a kidney transplant.  Daily use of 2 kinds of pain killers (particularly aspirin and acetaminophen together) with caffeine or codeine are most likely to damage the kidneys. These mixtures are often sold as powders or tablets.  Acute kidney failure requires emergency dialysis to clean the blood. Kidney damage is often reversible. Normal kidney function returns after the emergency is over and the analgesic use is stopped.  HOME CARE INSTRUCTIONS  Patients with conditions that put them at risk for acute kidney failure should check  with their caregivers before taking analgesic medicine. People who take over-the-counter pain killers on a regular basis should check with their caregiver. Your caregiver may be able to recommend a safer alternative. FOR MORE INFORMATION NIDDK: CheatPrevention.com.au Document Released: 10/14/2003 Document Revised: 05/07/2011 Document Reviewed: 09/15/2007 [facility] Patient Information 2014 [place], [place].

## 2013-03-17 NOTE — Progress Notes (Signed)
Subjective; Patient states she feels much better. She denies nausea vomiting or abdominal pain. She's hungry. She would like to eat cereal this morning. She denies shortness of breath or chest pain. Her son states that she hasn't eaten well for last 2 weeks or so. Objective; BP 91/43  Pulse 57  Temp(Src) 97.6 F (36.4 C) (Axillary)  Resp 15  Ht 5' (1.524 m)  Wt 107 lb 12.9 oz (48.9 kg)  BMI 21.05 kg/m2  SpO2 100% Patient is alert and does not appear to be in any distress. She is less jaundiced. Cardiac exam with regular rhythm normal S1 and S2. No murmur or gallop noted. Few crackles noted at bases. Abdomen is symmetrical with normal bowel sounds. Soft and nontender without organomegaly or masses. No early edema noted.  Lab data; WBC 11.8, H&H 8.7 and 26.3 and platelet count 182K. MCV 105.6 Serum amylase 28 Serum sodium 144, potassium 3.8, carotid 106, CO2 26, BUN 25 and creatinine 1.65 Bilirubin 5.9, a PO2 89, AST 223, ALT 72 albumin 1.8 Serum calcium 7.9 INR 1.08 on 03/16/2013. BNP 7350 on 03/16/2013 Portable chest film from yesterday afternoon showed small right pleural effusion and diffuse interstitial edema. Blood cultures were negative.   Assessment;  #1. Acute cholangitis. Patient is status post ERCP with biliary stenting. This significant drop in group and an alkaline phosphatase. GI symptoms have resolved. She was noted to have distended gallbladder on admission CT. This needs to be reevaluated. This gallbladder is decompressed she will need cholecystostomy as she is too high risk for cholecystectomy. #2. CHF. Patient developed CHF following ERCP/anesthesia. Patient is getting an echocardiogram this am. #3. Macrocytic anemia. #4. Malnutrition.  Recommendations; Advance diet. Lab in am. US in am to document decompression of GB. Troponin level. Serum iron TIBC ferritin B12 and folate levels.

## 2013-03-17 NOTE — Consult Note (Addendum)
Reason for Consult: Cholelithiasis, cholecystitis, choledocholithiasis Referring Physician: [facility]  Erica Dunn is an 80 y.o. female.  HPI: Patient is a 80 year old white female who presented with abdominal pain and jaundice. She was found on CT scan the abdomen to have a distended gallbladder with a 3 cm focus of increased attenuation secondary to either cholelithiasis or sludge, as well as a dilated common bile duct with multiple common bile duct stones present. She underwent ERCP with stone extraction and stent placement yesterday by Dr. Laural Golden. Postoperatively, the patient had episode of heart failure and respiratory depression. Her BNP was noted to be significantly elevated. She did not require intubation. This morning, she feels much better. She denies any abdominal pain. She states she is not significantly short of breath. Cardiac workup is pending.  Past Medical History  Diagnosis Date  . GERD (gastroesophageal reflux disease)   . Hyperthyroidism   . Hypertension     Past Surgical History  Procedure Laterality Date  . Breast surgery Left     60 years ago  . Abdominal hysterectomy      No family history on file.  Social History:  reports that she has never smoked. She does not have any smokeless tobacco history on file. She reports that she does not drink alcohol or use illicit drugs.  Allergies: No Known Allergies  Medications: I have reviewed the patient's current medications.  Results for orders placed during the hospital encounter of 03/15/13 (from the past 48 hour(s))  CBC WITH DIFFERENTIAL     Status: Abnormal   Collection Time    03/15/13 10:30 PM      Result Value Range   WBC 18.2 (*) 4.0 - 10.5 K/uL   RBC 3.48 (*) 3.87 - 5.11 MIL/uL   Hemoglobin 12.5  12.0 - 15.0 g/dL   HCT 36.4  36.0 - 46.0 %   MCV 104.6 (*) 78.0 - 100.0 fL   MCH 35.9 (*) 26.0 - 34.0 pg   MCHC 34.3  30.0 - 36.0 g/dL   RDW 16.9 (*) 11.5 - 15.5 %   Platelets 340  150 - 400 K/uL    Neutrophils Relative % 91 (*) 43 - 77 %   Neutro Abs 16.5 (*) 1.7 - 7.7 K/uL   Lymphocytes Relative 5 (*) 12 - 46 %   Lymphs Abs 0.8  0.7 - 4.0 K/uL   Monocytes Relative 5  3 - 12 %   Monocytes Absolute 0.9  0.1 - 1.0 K/uL   Eosinophils Relative 0  0 - 5 %   Eosinophils Absolute 0.0  0.0 - 0.7 K/uL   Basophils Relative 0  0 - 1 %   Basophils Absolute 0.0  0.0 - 0.1 K/uL  COMPREHENSIVE METABOLIC PANEL     Status: Abnormal   Collection Time    03/15/13 10:30 PM      Result Value Range   Sodium 140  137 - 147 mEq/L   Potassium 4.2  3.7 - 5.3 mEq/L   Chloride 94 (*) 96 - 112 mEq/L   CO2 29  19 - 32 mEq/L   Glucose, Bld 169 (*) 70 - 99 mg/dL   BUN 20  6 - 23 mg/dL   Creatinine, Ser 1.66 (*) 0.50 - 1.10 mg/dL   Calcium 10.1  8.4 - 10.5 mg/dL   Total Protein 8.3  6.0 - 8.3 g/dL   Albumin 3.2 (*) 3.5 - 5.2 g/dL   AST 206 (*) 0 - 37 U/L   ALT  88 (*) 0 - 35 U/L   Alkaline Phosphatase 573 (*) 39 - 117 U/L   Total Bilirubin 11.8 (*) 0.3 - 1.2 mg/dL   GFR calc non Af Amer 26 (*) >90 mL/min   GFR calc Af Amer 30 (*) >90 mL/min   Comment: (NOTE)     The eGFR has been calculated using the CKD EPI equation.     This calculation has not been validated in all clinical situations.     eGFR's persistently <90 mL/min signify possible Chronic Kidney     Disease.  LIPASE, BLOOD     Status: None   Collection Time    03/15/13 10:30 PM      Result Value Range   Lipase 27  11 - 59 U/L  PRO B NATRIURETIC PEPTIDE     Status: Abnormal   Collection Time    03/16/13  9:00 AM      Result Value Range   Pro B Natriuretic peptide (BNP) 7350.0 (*) 0 - 450 pg/mL  CULTURE, BLOOD (ROUTINE X 2)     Status: None   Collection Time    03/16/13  9:16 AM      Result Value Range   Specimen Description Blood     Special Requests NONE     Culture NO GROWTH <24 HRS     Report Status PENDING    CULTURE, BLOOD (ROUTINE X 2)     Status: None   Collection Time    03/16/13  9:16 AM      Result Value Range    Specimen Description Blood     Special Requests NONE     Culture NO GROWTH <24 HRS     Report Status PENDING    TSH     Status: None   Collection Time    03/16/13  9:16 AM      Result Value Range   TSH 0.845  0.350 - 4.500 uIU/mL   Comment: Performed at [facility]     Status: Abnormal   Collection Time    03/16/13  9:16 AM      Result Value Range   Sodium 142  137 - 147 mEq/L   Potassium 3.9  3.7 - 5.3 mEq/L   Chloride 101  96 - 112 mEq/L   CO2 26  19 - 32 mEq/L   Glucose, Bld 134 (*) 70 - 99 mg/dL   BUN 21  6 - 23 mg/dL   Creatinine, Ser 1.55 (*) 0.50 - 1.10 mg/dL   Calcium 8.5  8.4 - 10.5 mg/dL   Total Protein 6.8  6.0 - 8.3 g/dL   Albumin 2.4 (*) 3.5 - 5.2 g/dL   AST 157 (*) 0 - 37 U/L   ALT 68 (*) 0 - 35 U/L   Alkaline Phosphatase 438 (*) 39 - 117 U/L   Total Bilirubin 10.7 (*) 0.3 - 1.2 mg/dL   GFR calc non Af Amer 28 (*) >90 mL/min   GFR calc Af Amer 33 (*) >90 mL/min   Comment: (NOTE)     The eGFR has been calculated using the CKD EPI equation.     This calculation has not been validated in all clinical situations.     eGFR's persistently <90 mL/min signify possible Chronic Kidney     Disease.  CBC     Status: Abnormal   Collection Time    03/16/13  9:16 AM      Result Value Range  WBC 20.5 (*) 4.0 - 10.5 K/uL   RBC 3.03 (*) 3.87 - 5.11 MIL/uL   Hemoglobin 10.8 (*) 12.0 - 15.0 g/dL   HCT 31.9 (*) 36.0 - 46.0 %   MCV 105.3 (*) 78.0 - 100.0 fL   MCH 35.6 (*) 26.0 - 34.0 pg   MCHC 33.9  30.0 - 36.0 g/dL   RDW 18.1 (*) 11.5 - 15.5 %   Platelets 261  150 - 400 K/uL   Comment: DELTA CHECK NOTED  PROTIME-INR     Status: None   Collection Time    03/16/13  9:16 AM      Result Value Range   Prothrombin Time 13.8  11.6 - 15.2 seconds   INR 1.08  0.00 - 1.49  SURGICAL PCR SCREEN     Status: None   Collection Time    03/16/13  9:27 AM      Result Value Range   MRSA, PCR NEGATIVE  NEGATIVE   Staphylococcus aureus NEGATIVE   NEGATIVE   Comment:            The Xpert SA Assay (FDA     approved for NASAL specimens     in patients over 60 years of age),     is one component of     a comprehensive surveillance     program.  Test performance has     been validated by [employer] for patients greater     than or equal to 89 year old.     It is not intended     to diagnose infection nor to     guide or monitor treatment.  BLOOD GAS, ARTERIAL     Status: Abnormal   Collection Time    03/16/13  2:55 PM      Result Value Range   FIO2 100.00     Delivery systems VENTILATOR     Mode BILEVEL POSITIVE AIRWAY PRESSURE     Inspiratory PAP 20     Expiratory PAP 5     pH, Arterial 7.274 (*) 7.350 - 7.450   pCO2 arterial 51.4 (*) 35.0 - 45.0 mmHg   pO2, Arterial 259.0 (*) 80.0 - 100.0 mmHg   Bicarbonate 23.0  20.0 - 24.0 mEq/L   TCO2 20.0  0 - 100 mmol/L   Acid-base deficit 2.8 (*) 0.0 - 2.0 mmol/L   O2 Saturation 99.3     Patient temperature 37.0     Collection site RIGHT RADIAL     Drawn by [id number]     Sample type ARTERIAL     Allens test (pass/fail) PASS  PASS  CBC     Status: Abnormal   Collection Time    03/17/13  4:37 AM      Result Value Range   WBC 11.8 (*) 4.0 - 10.5 K/uL   RBC 2.49 (*) 3.87 - 5.11 MIL/uL   Hemoglobin 8.7 (*) 12.0 - 15.0 g/dL   Comment: DELTA CHECK NOTED     RESULT REPEATED AND VERIFIED   HCT 26.3 (*) 36.0 - 46.0 %   MCV 105.6 (*) 78.0 - 100.0 fL   MCH 34.9 (*) 26.0 - 34.0 pg   MCHC 33.1  30.0 - 36.0 g/dL   RDW 17.4 (*) 11.5 - 15.5 %   Platelets 182  150 - 400 K/uL   Comment: DELTA CHECK NOTED  AMYLASE     Status: None   Collection Time    03/17/13  4:37  AM      Result Value Range   Amylase 28  0 - 105 U/L  COMPREHENSIVE METABOLIC PANEL     Status: Abnormal   Collection Time    03/17/13  4:57 AM      Result Value Range   Sodium 144  137 - 147 mEq/L   Potassium 3.8  3.7 - 5.3 mEq/L   Chloride 106  96 - 112 mEq/L   CO2 26  19 - 32 mEq/L   Glucose, Bld 79  70 - 99  mg/dL   BUN 25 (*) 6 - 23 mg/dL   Creatinine, Ser 1.65 (*) 0.50 - 1.10 mg/dL   Calcium 7.9 (*) 8.4 - 10.5 mg/dL   Total Protein 5.1 (*) 6.0 - 8.3 g/dL   Albumin 1.8 (*) 3.5 - 5.2 g/dL   AST 223 (*) 0 - 37 U/L   ALT 72 (*) 0 - 35 U/L   Alkaline Phosphatase 289 (*) 39 - 117 U/L   Total Bilirubin 5.9 (*) 0.3 - 1.2 mg/dL   GFR calc non Af Amer 26 (*) >90 mL/min   GFR calc Af Amer 30 (*) >90 mL/min   Comment: (NOTE)     The eGFR has been calculated using the CKD EPI equation.     This calculation has not been validated in all clinical situations.     eGFR's persistently <90 mL/min signify possible Chronic Kidney     Disease.    Ct Abdomen Pelvis Wo Contrast  03/16/2013   CLINICAL DATA:  Nausea and upper abdominal pain.  EXAM: CT ABDOMEN AND PELVIS WITHOUT CONTRAST  TECHNIQUE: Multidetector CT imaging of the abdomen and pelvis was performed following the standard protocol without intravenous contrast.  COMPARISON:  None.  FINDINGS: A trace right pleural effusion is noted, with associated atelectasis. Mild apparent wall thickening at the distal esophagus may reflect sequelae of gastroesophageal reflux.  Scattered calcified granulomata are seen within the liver and spleen. There is prominence of the intrahepatic biliary ducts. The common hepatic duct is dilated to 1.5 cm in diameter, with two obstructing stones seen distally at the level of the pancreatic head, measuring 1.1 cm and 1.0 cm. The gallbladder is markedly distended, with a 2.9 cm focus of mildly increased attenuation possibly reflecting a sludge ball or stone.  The spleen is otherwise unremarkable in appearance. The pancreas and adrenal glands are within normal limits.  The kidneys are unremarkable in appearance. There is no evidence of hydronephrosis. No renal or ureteral stones are seen. No perinephric stranding is appreciated.  No free fluid is identified. The small bowel is unremarkable in appearance. The stomach is within normal limits.  No acute vascular abnormalities are seen. Diffuse calcification is noted along the abdominal aorta and its branches.  The appendix is normal in caliber, without evidence for appendicitis. Stool is seen partially filling the colon. Scattered diverticulosis is noted along the sigmoid colon, without evidence of diverticulitis.  The bladder is mildly distended and grossly unremarkable in appearance. The patient is status post hysterectomy. The ovaries are relatively symmetric; no suspicious adnexal masses are seen. No inguinal lymphadenopathy is seen.  No acute osseous abnormalities are identified. Vacuum phenomenon is noted at L3-L4.  IMPRESSION: 1. Markedly distended gallbladder, with a 2.9 cm focus of mildly increased attenuation possibly reflecting a large stone or sludge ball. Dilatation of the common hepatic duct to 1.5 cm in diameter, with two obstructing stones seen distally at the level of the pancreatic head, measuring 1.1  cm and 1.0 cm. Associated prominence of the intrahepatic biliary ducts. ERCP would be helpful for further evaluation and treatment. 2. Trace right pleural effusion, with associated atelectasis. 3. Mild apparent wall thickening at the distal esophagus may reflect sequelae of gastroesophageal reflux. 4. Diffuse calcification along the abdominal aorta and its branches. 5. Scattered diverticulosis along the sigmoid colon, without evidence of diverticulitis.  These results were called by telephone at the time of interpretation on 03/16/2013 at 1:57 AM to Dr. Virgel Manifold, who verbally acknowledged these results.   Electronically Signed   By: Garald Balding M.D.   On: 03/16/2013 01:57   Dg Chest Port 1 View  03/16/2013   CLINICAL DATA:  Acute respiratory arrest.  EXAM: PORTABLE CHEST - 1 VIEW  COMPARISON:  None.  FINDINGS: Heart size is prominent. Diffuse interstitial infiltrates are seen suspicious for interstitial edema. Probable small layering right pleural effusion also noted. No evidence  of pulmonary consolidation.  IMPRESSION: Suspect diffuse interstitial edema and small right pleural effusion.   Electronically Signed   By: Earle Gell M.D.   On: 03/16/2013 14:41   Dg Ercp Biliary & Pancreatic Ducts  03/16/2013   CLINICAL DATA:  Choledocholithiasis  EXAM: ERCP  TECHNIQUE: Multiple spot images obtained with the fluoroscopic device and submitted for interpretation post-procedure.  COMPARISON:  CT scan 03/16/2013  FINDINGS: Multiple fluoroscopic spot images from an ERCP demonstrate a dilated common bile duct with multiple large stones. Subsequent sphincterotomy and balloon passage. The stones remain in the distal common bile duct.  Mild narrowing near the confluence of the hepatic ducts is likely due to compression by a dilated gallbladder which correlates with the CT finding.  IMPRESSION: Choledocholithiasiswith sphincterotomy, balloon passage and plastic stent placement. Common bowel duct stones remain.  Mild narrowing of the common hepatic duct likely due to a dilated gallbladder.  These images were submitted for radiologic interpretation only. Please see the procedural report for the amount of contrast and the fluoroscopy time utilized.   Electronically Signed   By: Kalman Jewels M.D.   On: 03/16/2013 12:21    ROS: See chart Blood pressure 91/43, pulse 57, temperature 97.6 F (36.4 C), temperature source Axillary, resp. rate 15, height 5' (1.524 m), weight 48.9 kg (107 lb 12.9 oz), SpO2 100.00%. Physical Exam: Pleasant frail white female in no acute distress. Abdomen is soft, nontender, nondistended. No rigidity noted.  Assessment/Plan: Impression: Cholecystitis, cholelithiasis, status post ERCP for choledocholithiasis. Jaundice is resolving. Leukocytosis is improving. She had a significant episode of CHF with respiratory depression. Cardiac workup is pending. As she has been treated for her cholangitis, her labs have improved and she did not require emergent cholecystectomy at this  time. I will be out of town for the rest of the week. Should she require urgent surgical intervention, this may have to be done outside of any [facility] as we may not have sufficient ancillary support available for her. This all is pending cardiac workup and how the patient progresses.  Chandon Lazcano A 03/17/2013, 8:39 AM     Addendum: If the patient deteriorates and is felt that this is secondary to worsening cholecystitis, a cholecystostomy tube could be placed to temporize the patient without need for risky general anesthesia for cholecystectomy.

## 2013-03-17 NOTE — Clinical Documentation Improvement (Signed)
  Please clarify respiratory status. Thank you. 1) Possible Clinical Conditions?   Respiratory Acidosis Metabolic Acidosis Respiratory Alkalosis Metabolic Alkalosis Other Condition__________________ Cannot Clinically Determine   2) Possible Clinical Conditions?  Acute Respiratory Failure Acute on Chronic Respiratory Failure Chronic Respiratory Failure Other Condition Cannot Clinically Determine   Signs and Symptoms:  Unresponsive, hypoxic w/ decreased respiration BL LE crackles, +S3;   Supporting Information: Code blue called possible opioid/benzo overdose peri-procedure   Diagnostics: Component     Latest Ref Rng 03/16/2013  FIO2      100.00  Delivery systems      VENTILATOR  Mode      BILEVEL POSITIVE AIRWAY PRESSURE  Inspiratory PAP      20  Expiratory PAP      5  pH, Arterial     7.350 - 7.450 7.274 (L)  pCO2 arterial     35.0 - 45.0 mmHg 51.4 (H)  pO2, Arterial     80.0 - 100.0 mmHg 259.0 (H)  Bicarbonate     20.0 - 24.0 mEq/L 23.0  TCO2     0 - 100 mmol/L 20.0  Acid-base deficit     0.0 - 2.0 mmol/L 2.8 (H)  O2 Saturation      99.3  Patient temperature      37.0  Collection site      RIGHT RADIAL  Drawn by      [phone]  Sample type      ARTERIAL  Allens test (pass/fail)     PASS PASS   Treatment  BiPap Narcan, flumazenil is given to reverse possible opioid/benzo overdose peri-procedure Continuous pulse oximetry Keep O2 sats >92% "will give lasix 40mg , obtain ECG, echo, CXR. ABG, cont BiPAP; cont supportive care "  Thank You, Harless Littenebora T Kynlei Piontek ,RN Clinical Documentation Specialist:  [phone]  [facility] Information Management

## 2013-03-18 ENCOUNTER — Other Ambulatory Visit: Payer: Self-pay

## 2013-03-18 ENCOUNTER — Inpatient Hospital Stay (HOSPITAL_COMMUNITY): Payer: Medicare Other

## 2013-03-18 ENCOUNTER — Encounter (HOSPITAL_COMMUNITY): Payer: Self-pay | Admitting: Internal Medicine

## 2013-03-18 DIAGNOSIS — I5031 Acute diastolic (congestive) heart failure: Secondary | ICD-10-CM

## 2013-03-18 LAB — COMPREHENSIVE METABOLIC PANEL
ALBUMIN: 1.7 g/dL — AB (ref 3.5–5.2)
ALT: 67 U/L — ABNORMAL HIGH (ref 0–35)
AST: 155 U/L — AB (ref 0–37)
Alkaline Phosphatase: 342 U/L — ABNORMAL HIGH (ref 39–117)
BILIRUBIN TOTAL: 3.5 mg/dL — AB (ref 0.3–1.2)
BUN: 26 mg/dL — ABNORMAL HIGH (ref 6–23)
CHLORIDE: 105 meq/L (ref 96–112)
CO2: 27 mEq/L (ref 19–32)
Calcium: 8.4 mg/dL (ref 8.4–10.5)
Creatinine, Ser: 1.8 mg/dL — ABNORMAL HIGH (ref 0.50–1.10)
GFR calc Af Amer: 27 mL/min — ABNORMAL LOW (ref >90)
GFR calc non Af Amer: 24 mL/min — ABNORMAL LOW (ref >90)
Glucose, Bld: 80 mg/dL (ref 70–99)
Potassium: 3.5 mEq/L — ABNORMAL LOW (ref 3.7–5.3)
Sodium: 140 mEq/L (ref 137–147)
Total Protein: 5.2 g/dL — ABNORMAL LOW (ref 6.0–8.3)

## 2013-03-18 LAB — CBC
HEMATOCRIT: 26.3 % — AB (ref 36.0–46.0)
Hemoglobin: 8.8 g/dL — ABNORMAL LOW (ref 12.0–15.0)
MCH: 35.5 pg — ABNORMAL HIGH (ref 26.0–34.0)
MCHC: 33.5 g/dL (ref 30.0–36.0)
MCV: 106 fL — ABNORMAL HIGH (ref 78.0–100.0)
Platelets: 190 10*3/uL (ref 150–400)
RBC: 2.48 MIL/uL — AB (ref 3.87–5.11)
RDW: 17.5 % — AB (ref 11.5–15.5)
WBC: 6.9 10*3/uL (ref 4.0–10.5)

## 2013-03-18 LAB — FOLATE RBC: RBC Folate: 868 ng/mL — ABNORMAL HIGH (ref >280)

## 2013-03-18 MED ORDER — ASPIRIN EC 81 MG PO TBEC
81.0000 mg | DELAYED_RELEASE_TABLET | Freq: Every day | ORAL | Status: DC
  Administered 2013-03-18 – 2013-03-22 (×5): 81 mg via ORAL
  Filled 2013-03-18 (×5): qty 1

## 2013-03-18 MED ORDER — FUROSEMIDE 20 MG PO TABS
20.0000 mg | ORAL_TABLET | Freq: Every day | ORAL | Status: DC
  Administered 2013-03-18 – 2013-03-22 (×5): 20 mg via ORAL
  Filled 2013-03-18 (×5): qty 1

## 2013-03-18 MED ORDER — POTASSIUM CHLORIDE CRYS ER 20 MEQ PO TBCR
40.0000 meq | EXTENDED_RELEASE_TABLET | Freq: Two times a day (BID) | ORAL | Status: DC
  Administered 2013-03-18 – 2013-03-22 (×9): 40 meq via ORAL
  Filled 2013-03-18 (×9): qty 2

## 2013-03-18 MED ORDER — DOCUSATE SODIUM 100 MG PO CAPS
100.0000 mg | ORAL_CAPSULE | Freq: Two times a day (BID) | ORAL | Status: DC
  Administered 2013-03-18 – 2013-03-21 (×6): 100 mg via ORAL
  Filled 2013-03-18 (×6): qty 1

## 2013-03-18 NOTE — Consult Note (Signed)
CARDIOLOGY CONSULT NOTE   Patient ID: Cardelia Sassano MRN: [id number] DOB/AGE: 78-Apr-1922 78 y.o.  Admit Date: 03/15/2013 Referring Physician: PTH Primary Physician: Erasmo Downer, MD Consulting Cardiologist: Nona Dell, MD Reason for Consultation: Diastolic CHF  Clinical Summary Ms. Hoppel is a 80 y.o.female with history of hypertension, hypothyroidism, and GERD admitted initially on 03/16/2013 for acute abdominal pain. She was subsequently diagnosed with cholelithiasis and cholangitis. The patient was seen by Dr. Karilyn Cota and underwent ERCP, stent placement to the biliary duct, with decompression in the setting of distended gallbladder with a 3 cm focus increased in attenuation secondary to cholelithiasis and common bile duct stones.  The patient subsequently had an episode of respiratory depression requiring  BiPAP, with evidence of possible diastolic heart failure. She was given IV Lasix, naloxone/flumazenil and bronchodilators with improvement in her symptoms.      Cardiac enzymes were completed, with a single elevation in troponin I of 0.38, followed by a normal troponin later that same day. Echocardiogram was completed on 03/17/2013 showing normal LVEF of 55-60%, with mild LVH, and grade 1 diastolic dysfunction. She was noted to have a sclerotic aortic valve with trivial aortic regurgitation, mild left atrial enlargement, mild tricuspid regurg, and mild RV enlargement. She is no longer on IV Lasix or any diuretics at this time. Pro BNP was found to be elevated, greater than 7000.  She is currently being treated for sepsis, in the setting of cholecystitis. Blood cultures have been drawn, revealing no growth over the last 48 hours. She is continued on Zosyn and 2.25 g daily and IV fluids which are now at [facility]. Marland Kitchen No surgery is planned at this time. Consideration for bilary drain if necessary via surgery.   She is currently pain free, eating and feeling better. No complaints of chest  pain or dyspnea.      No Known Allergies  Medications Scheduled Medications: . acyclovir  800 mg Oral BID  . aspirin EC  81 mg Oral Daily  . beta carotene w/minerals  1 tablet Oral BID  . furosemide  20 mg Oral Daily  . levothyroxine  50 mcg Oral QAC breakfast  . piperacillin-tazobactam (ZOSYN)  IV  2.25 g Intravenous Q8H  . potassium chloride  40 mEq Oral BID  . prednisoLONE acetate  1 drop Left Eye Daily  . timolol  1 drop Right Eye Daily    PRN Medications: fentaNYL, hydrALAZINE, morphine injection, naLOXone (NARCAN)  injection, ondansetron (ZOFRAN) IV, ondansetron, sodium chloride   Past Medical History  Diagnosis Date  . GERD (gastroesophageal reflux disease)   . Hyperthyroidism   . Essential hypertension, benign   . Aortic atherosclerosis     Documented by CT imaging    Past Surgical History  Procedure Laterality Date  . Breast surgery Left     60 years ago  . Abdominal hysterectomy    . Ercp  03/16/2013    Procedure: ENDOSCOPIC RETROGRADE CHOLANGIOPANCREATOGRAPHY (ERCP) (Duct not cleared of all stones);  Surgeon: Malissa Hippo, MD;  Location: [facility];  Service: Endoscopy;;  . Sphincterotomy  03/16/2013    Procedure: Dennison Mascot;  Surgeon: Malissa Hippo, MD;  Location: [facility];  Service: Endoscopy;;  . Balloon dilation  03/16/2013    Procedure: Marvis Repress DILATION;  Surgeon: Malissa Hippo, MD;  Location: [facility];  Service: Endoscopy;;  . Removal of stones  03/16/2013    Procedure: REMOVAL OF STONES WITH STONE BASKET;  Surgeon: Malissa Hippo, MD;  Location: [facility];  Service: Endoscopy;;  . Biliary stent placement  03/16/2013    Procedure: BILIARY STENT PLACEMENT;  Surgeon: Malissa Hippo, MD;  Location: [facility];  Service: Endoscopy;;    Social History Ms. Armendarez reports that she has never smoked. She does not have any smokeless tobacco history on file. Ms. Sawatzky reports that she does not drink alcohol.   Review of Systems No chest pain or palpitations.  Otherwise reviewed and negative except as outlined.  Physical Examination Blood pressure 112/51, pulse 56, temperature 98.1 F (36.7 C), temperature source Oral, resp. rate 24, height 5' (1.524 m), weight 111 lb 15.9 oz (50.8 kg), SpO2 100.00%.  Intake/Output Summary (Last 24 hours) at 03/18/13 1603 Last data filed at 03/18/13 0500  Gross per 24 hour  Intake     50 ml  Output    450 ml  Net   -400 ml    Telemetry: NSR and sinus bradycardia with PACs.  Appears comfortable at rest. HEENT: Conjunctiva and lids normal, oropharynx clear. Erythema and swelling under the left eye orbit and temple.  Neck: Supple, no elevated JVP or carotid bruits, no thyromegaly. Lungs: Clear to auscultation, nonlabored breathing at rest wearing oxygen.  Cardiac: Regular rate and rhythm, no S3 or significant systolic murmur, no pericardial rub. Abdomen: Soft, nontender, bowel sounds present, no guarding or rebound. Extremities: No pitting edema, distal pulses 1-2+. Skin: Warm and dry. Musculoskeletal: No kyphosis. Neuropsychiatric: Alert and oriented x2, affect grossly appropriate.    Echocardiogram: 03/17/2013 Left ventricle: The cavity size was normal. Wall thickness was increased in a pattern of mild LVH. Systolic function was normal. The estimated ejection fraction was in the range of 55% to 60%. There is mild hypokinesis of the distalanteroseptal myocardium. Doppler parameters are consistent with abnormal left ventricular relaxation (grade 1 diastolic dysfunction). Doppler parameters are consistent with elevated ventricular end-diastolic filling pressure. - Aortic valve: Moderately calcified annulus. Trileaflet; mildly calcified leaflets. Trivial regurgitation. Mean gradient: 3mm Hg (S). - Mitral valve: Calcified annulus. Mildly thickened leaflets . Trivial regurgitation. - Left atrium: The atrium was mildly dilated. - Right ventricle: The cavity size was mildly dilated. - Right atrium:  Central venous pressure: 3mm Hg (est). - Tricuspid valve: Mild regurgitation. - Pulmonary arteries: PA peak pressure: 29mm Hg (S). - Pericardium, extracardiac: There was no pericardial effusion.   Lab Results  Basic Metabolic Panel:  Recent Labs Lab 03/15/13 2230 03/16/13 0916 03/17/13 0457 03/18/13 0449  NA 140 142 144 140  K 4.2 3.9 3.8 3.5*  CL 94* 101 106 105  CO2 29 26 26 27   GLUCOSE 169* 134* 79 80  BUN 20 21 25* 26*  CREATININE 1.66* 1.55* 1.65* 1.80*  CALCIUM 10.1 8.5 7.9* 8.4    Liver Function Tests:  Recent Labs Lab 03/15/13 2230 03/16/13 0916 03/17/13 0457 03/18/13 0449  AST 206* 157* 223* 155*  ALT 88* 68* 72* 67*  ALKPHOS 573* 438* 289* 342*  BILITOT 11.8* 10.7* 5.9* 3.5*  PROT 8.3 6.8 5.1* 5.2*  ALBUMIN 3.2* 2.4* 1.8* 1.7*    CBC:  Recent Labs Lab 03/15/13 2230 03/16/13 0916 03/17/13 0437 03/18/13 0449  WBC 18.2* 20.5* 11.8* 6.9  NEUTROABS 16.5*  --   --   --   HGB 12.5 10.8* 8.7* 8.8*  HCT 36.4 31.9* 26.3* 26.3*  MCV 104.6* 105.3* 105.6* 106.0*  PLT 340 261 182 190    Cardiac Enzymes:  Recent Labs Lab 03/17/13 0914 03/17/13 2240  TROPONINI 0.38* <0.30    BNP: 7,350.  Radiology: CXR 03/16/2013 Heart size is prominent. Diffuse interstitial infiltrates are seen suspicious for interstitial edema. Probable small layering right pleural effusion also noted. No evidence of pulmonary consolidation.  IMPRESSION: Suspect diffuse interstitial edema and small right pleural effusion.   ECG: Sinus rhythm with nonspecific ST-T changes and PVCs.   Impression and Recommendations  1. Episode of respiratory distress as outlined above, possible contribution from sedative medications, although further suspect diastolic heart failure, perhaps exacerbation of underlying ischemic heart disease. Single troponin I was minimally elevated and ECG is nonacute. She does not endorse any chest pain symptoms. Echocardiogram shows preserved LVEF, but  some distal anteroseptal mild hypokinesis. Chest x-ray from 1/19 demonstrated diffuse interstitial edema and small right pleural effusion.  2. presentation with cholelithiasis and cholangitis, now status post ERCP with biliary stent placement. Followup abdominal ultrasound done today his shows continued dilatation of the gallbladder with gallstones. Not certain at this point whether she will require percutaneous drain or not.  3. History of hypertension, recent blood pressures normal to low.  4. Acute on presumably chronic renal insufficiency, creatinine 1.8.   Situation discussed with the patient and her daughter. Aspirin is being initiated at 81 mg daily. Will also resume low-dose oral Lasix for the time being, and continue potassium supplements. No beta blocker with resting bradycardia, no ACE inhibitor or ARB with acute on chronic renal insufficiency. Would generally recommend conservative management from a cardiac perspective. Do not anticipate invasive cardiac testing. We will continue to follow with you, particularly if she needs further procedures to address her gallbladder.  Signed: Bettey MareKathryn M. Lyman BishopLawrence NP [facility] 03/18/2013, 4:03 PM Co-Sign MD   Attending note:  Patient seen and examined. Reviewed records and modified above note by Ms. Lawrence NP including completion of the impression and recommendations section to reflect my findings and plan.  Jonelle SidleSamuel G. Vallarie Fei, M.D., F.A.C.C.

## 2013-03-18 NOTE — Care Management Note (Signed)
    Page 1 of 1   03/18/2013     3:39:35 PM   CARE MANAGEMENT NOTE 03/18/2013  Patient:  Erica Dunn,Erica Dunn   Account Number:  [id number]  Date Initiated:  03/18/2013  Documentation initiated by:  Sharrie RothmanBLACKWELL,Sehaj Mcenroe C  Subjective/Objective Assessment:   Pt admitted from home with obstructive jaundice. Pts daughter lives with pt and provides care to pt. Pt if fairly independent with ADL's. Pt has shower stool and grab bars in tub.     Action/Plan:   Pts daughter denies any CM needs at this time.   Anticipated DC Date:  03/21/2013   Anticipated DC Plan:  HOME/SELF CARE      DC Planning Services  CM consult      Choice offered to / List presented to:             Status of service:  Completed, signed off Medicare Important Message given?   (If response is "NO", the following Medicare IM given date fields will be blank) Date Medicare IM given:   Date Additional Medicare IM given:    Discharge Disposition:  HOME/SELF CARE  Per UR Regulation:    If discussed at Long Length of Stay Meetings, dates discussed:    Comments:  03/18/13 1540 Arlyss Queenammy Teja Costen, RN BSN CM

## 2013-03-18 NOTE — Progress Notes (Signed)
Patient ID: Erica JoMary Dunn, female   DOB: 02/13/1921, 80 y.o.   MRN: [id number] She tells me there is no abdominal pain. Her color is better today. Sclera is anicteric. She underwent an ERCP yesterday.Impression:  Dilated biliary system with two large calcified stones.  Stone fragments removed using mechanical lithotripter but not be cleared of all pieces despite balloon dilation of ampulla.  10 French 9 cm long plastic stent laser biliary decompression. She has eaten all of her diet this am. No abdominal pain. Resting comfortable. Filed Vitals:   03/18/13 0500 03/18/13 0600 03/18/13 0613 03/18/13 0730  BP: 105/50  112/51   Pulse: 52 25 56   Temp:    97.7 F (36.5 C)  TempSrc:    Oral  Resp:      Height:      Weight: 111 lb 15.9 oz (50.8 kg)     SpO2: 99% 91% 100%    Assessment: CBD stones. She will need a repeat ERCP in 6-8 weeks. Her liver enzymes are coming down. Will continue to monitor.

## 2013-03-18 NOTE — Progress Notes (Signed)
Cardiology note appreciated. Patient denies nausea vomiting chest pain shortness of breath or abdominal pain. Her daughter states she is eating well. Her bowels have not moved since admission. Ultrasound reviewed; the main gallbladder appears to be less distended than it was on CT. Will review study with radiologist tomorrow. Given patient's abdominal exam is benign I do not feel there is rush to proceed with cholecystostomy. Patient begun on Colace 100 mg by mouth twice a day.

## 2013-03-18 NOTE — Progress Notes (Signed)
Dr. Blake DivineAkula returned page and gave order for patient to have a 12-lead EKG.

## 2013-03-18 NOTE — Progress Notes (Signed)
[facility] PROGRESS NOTE  Erica Dunn HLK:[id number] DOB: 1920/12/15 DOA: 03/15/2013 PCP: Renee Rival, MD  Assessment/Plan: 80 year old female with history of , HTN, hypothyroidism, GERD came to the hospital with worsening abdominal pain for past one week found to have cholelithiasis, cholangitis, sepsis s/p ERCP developed respiratory failure resolved on BiPAP   1. Sepsis/hypotension/cholangitis; CT: distended gallbladder, hepatic duct, cholelithiasis  -s/p ERCP 1/19: stent laserbiliary decompression -cont IV atx, antiemetics, appreciate, GI, surgery eval; diet per GI  - repeat US abdomen to evaluate for decompression of GB.   2. Leukocytosis likely due to above; cont IV atx; blood c/s: NGTD. Resolved.   3. Acute respiratory failure multifactorial due to opioid/benzo +CHF; code blue called on 1/19; patient in respiratory distress, but hemodynamically stable;  -resolved on BiPAP/naloxone/flumazenil+lasix IV; cont prn bronchodilators, oxygen, . Echo cardiogram revealed diastolic dysfunction. With elevated troponin yesterday and abnormal EKG , consulted cardiology for further recommendations. Her breathing has improved and she is not in distress any more. She also denies any chest pain or sob.   4. Acute diastolic CHF; unclear history; CRX: pulmonary edema, S3;  -improving on IV lasix;  cont diuresis prn as BP allows; expect worsening renal function  -mild positive trop possible stress related vs CKD vs demand ischemia; f/u troponin's are negative.   5. Abnormal LFTs, alk phos due to #1;  6. HTN currently hypotensive, hold ACE; prn hydralazine if hypertensive.   7. Hypothyroidism, cont levothyroxine; tsh within normal limits.  8. Acute anemia r/o blood loss; check occult blood; Tf prn;    Code Status: DNR Family Communication: d/w patient, updated family at the bedside, son  Disposition Plan: home when ready    Consultants:  GI  Procedures:  Pend ?  ERCP  Antibiotics:  Zosyn 1/19<<<   (indicate start date, and stop date if known)  HPI/Subjective: Alert, denies chest pain or sob.   Objective: Filed Vitals:   03/18/13 0613  BP: 112/51  Pulse: 56  Temp:   Resp:     Intake/Output Summary (Last 24 hours) at 03/18/13 0746 Last data filed at 03/18/13 0500  Gross per 24 hour  Intake     50 ml  Output    600 ml  Net   -550 ml   Filed Weights   03/16/13 0944 03/17/13 0500 03/18/13 0500  Weight: 48.535 kg (107 lb) 48.9 kg (107 lb 12.9 oz) 50.8 kg (111 lb 15.9 oz)    Exam:   General:  alert  Cardiovascular: s1,s2 rrr  Respiratory: ctab. Scattered rales at bases.   Abdomen: soft, nt, nd   Musculoskeletal: no LE edema   Data Reviewed: Basic Metabolic Panel:  Recent Labs Lab 03/15/13 2230 03/16/13 0916 03/17/13 0457 03/18/13 0449  NA 140 142 144 140  K 4.2 3.9 3.8 3.5*  CL 94* 101 106 105  CO2 _0 GLUCOSE 169* 134* 79 80  BUN 20 21 25* 26*  CREATININE 1.66* 1.55* 1.65* 1.80*  CALCIUM 10.1 8.5 7.9* 8.4   Liver Function Tests:  Recent Labs Lab 03/15/13 2230 03/16/13 0916 03/17/13 0457 03/18/13 0449  AST 206* 157* 223* 155*  ALT 88* 68* 72* 67*  ALKPHOS 573* 438* 289* 342*  BILITOT 11.8* 10.7* 5.9* 3.5*  PROT 8.3 6.8 5.1* 5.2*  ALBUMIN 3.2* 2.4* 1.8* 1.7*    Recent Labs Lab 03/15/13 2230 03/17/13 0437  LIPASE 27  --   AMYLASE  --  28   No results found for this basename:  AMMONIA,  in the last 168 hours CBC:  Recent Labs Lab 03/15/13 2230 03/16/13 0916 03/17/13 0437 03/18/13 0449  WBC 18.2* 20.5* 11.8* 6.9  NEUTROABS 16.5*  --   --   --   HGB 12.5 10.8* 8.7* 8.8*  HCT 36.4 31.9* 26.3* 26.3*  MCV 104.6* 105.3* 105.6* 106.0*  PLT 340 261 182 190   Cardiac Enzymes:  Recent Labs Lab 03/17/13 0914 03/17/13 2240  TROPONINI 0.38* <0.30   BNP (last 3 results)  Recent Labs  03/16/13 0900  PROBNP 7350.0*   CBG:  Recent Labs Lab 03/17/13 1127  GLUCAP 109*     Recent Results (from the past 240 hour(s))  CULTURE, BLOOD (ROUTINE X 2)     Status: None   Collection Time    03/16/13  9:16 AM      Result Value Range Status   Specimen Description BLOOD LEFT FOREARM   Final   Special Requests BOTTLES DRAWN AEROBIC AND ANAEROBIC 5CC   Final   Culture NO GROWTH 1 DAY   Final   Report Status PENDING   Incomplete  CULTURE, BLOOD (ROUTINE X 2)     Status: None   Collection Time    03/16/13  9:16 AM      Result Value Range Status   Specimen Description BLOOD LEFT ANTECUBITAL   Final   Special Requests BOTTLES DRAWN AEROBIC AND ANAEROBIC 6CC   Final   Culture NO GROWTH 1 DAY   Final   Report Status PENDING   Incomplete  SURGICAL PCR SCREEN     Status: None   Collection Time    03/16/13  9:27 AM      Result Value Range Status   MRSA, PCR NEGATIVE  NEGATIVE Final   Staphylococcus aureus NEGATIVE  NEGATIVE Final   Comment:            The Xpert SA Assay (FDA     approved for NASAL specimens     in patients over 51 years of age),     is one component of     a comprehensive surveillance     program.  Test performance has     been validated by [employer] for patients greater     than or equal to 68 year old.     It is not intended     to diagnose infection nor to     guide or monitor treatment.     Studies: Dg Chest Port 1 View  03/16/2013   CLINICAL DATA:  Acute respiratory arrest.  EXAM: PORTABLE CHEST - 1 VIEW  COMPARISON:  None.  FINDINGS: Heart size is prominent. Diffuse interstitial infiltrates are seen suspicious for interstitial edema. Probable small layering right pleural effusion also noted. No evidence of pulmonary consolidation.  IMPRESSION: Suspect diffuse interstitial edema and small right pleural effusion.   Electronically Signed   By: Earle Gell M.D.   On: 03/16/2013 14:41   Dg Ercp Biliary & Pancreatic Ducts  03/16/2013   CLINICAL DATA:  Choledocholithiasis  EXAM: ERCP  TECHNIQUE: Multiple spot images obtained with the  fluoroscopic device and submitted for interpretation post-procedure.  COMPARISON:  CT scan 03/16/2013  FINDINGS: Multiple fluoroscopic spot images from an ERCP demonstrate a dilated common bile duct with multiple large stones. Subsequent sphincterotomy and balloon passage. The stones remain in the distal common bile duct.  Mild narrowing near the confluence of the hepatic ducts is likely due to compression by a dilated  gallbladder which correlates with the CT finding.  IMPRESSION: Choledocholithiasiswith sphincterotomy, balloon passage and plastic stent placement. Common bowel duct stones remain.  Mild narrowing of the common hepatic duct likely due to a dilated gallbladder.  These images were submitted for radiologic interpretation only. Please see the procedural report for the amount of contrast and the fluoroscopy time utilized.   Electronically Signed   By: Kalman Jewels M.D.   On: 03/16/2013 12:21    Scheduled Meds: . acyclovir  800 mg Oral BID  . beta carotene w/minerals  1 tablet Oral BID  . levothyroxine  50 mcg Oral QAC breakfast  . piperacillin-tazobactam (ZOSYN)  IV  2.25 g Intravenous Q8H  . prednisoLONE acetate  1 drop Left Eye Daily  . timolol  1 drop Right Eye Daily   Continuous Infusions:    Active Problems:   Choledocholithiasis with obstruction   Choledocholithiasis   HTN (hypertension)    Time spent: >35 minutes     [facility] Pager [phone]. If 7PM-7AM, please contact night-coverage at [email or web address], password [facility] 03/18/2013, 7:46 AM  LOS: 3 days

## 2013-03-18 NOTE — Progress Notes (Signed)
Patient converting between NS and AFib on the monitor.  Heart rate 56.  Patient has no complaints.  States "feels fine."  Patient trying to take a nap.  Dr. Blake DivineAkula notified via text page.

## 2013-03-19 ENCOUNTER — Inpatient Hospital Stay (HOSPITAL_COMMUNITY): Payer: Medicare Other

## 2013-03-19 ENCOUNTER — Encounter (HOSPITAL_COMMUNITY): Payer: Self-pay | Admitting: Student

## 2013-03-19 LAB — COMPREHENSIVE METABOLIC PANEL
ALT: 54 U/L — ABNORMAL HIGH (ref 0–35)
AST: 105 U/L — ABNORMAL HIGH (ref 0–37)
Albumin: 1.8 g/dL — ABNORMAL LOW (ref 3.5–5.2)
Alkaline Phosphatase: 393 U/L — ABNORMAL HIGH (ref 39–117)
BUN: 22 mg/dL (ref 6–23)
CALCIUM: 8.5 mg/dL (ref 8.4–10.5)
CO2: 26 mEq/L (ref 19–32)
Chloride: 106 mEq/L (ref 96–112)
Creatinine, Ser: 1.58 mg/dL — ABNORMAL HIGH (ref 0.50–1.10)
GFR calc non Af Amer: 28 mL/min — ABNORMAL LOW (ref >90)
GFR, EST AFRICAN AMERICAN: 32 mL/min — AB (ref >90)
GLUCOSE: 80 mg/dL (ref 70–99)
Potassium: 4.6 mEq/L (ref 3.7–5.3)
SODIUM: 141 meq/L (ref 137–147)
TOTAL PROTEIN: 5.4 g/dL — AB (ref 6.0–8.3)
Total Bilirubin: 3.2 mg/dL — ABNORMAL HIGH (ref 0.3–1.2)

## 2013-03-19 NOTE — Progress Notes (Signed)
Subjective; Patient has no complaints other than that her food is bland. She denies nausea vomiting or abdominal pain. She has good appetite and her daughter states she is eating well. Patient also denies shortness of breath. Objective; BP 120/68  Pulse 71  Temp(Src) 97.9 F (36.6 C) (Oral)  Resp 19  Ht 5' (1.524 m)  Wt 112 lb 10.5 oz (51.1 kg)  BMI 22.00 kg/m2  SpO2 98% Patient is alert and in no acute distress. Abdomen remains soft without tenderness organomegaly or masses. No LE edema noted.  Lab data; Serum sodium 141, potassium 4.6, chloride 106, CO2 26. BUN 22 and creatinine 1.58. Bilirubin 3.2, AP 393, AST 105, ALT 54, albumin 1.8  Assessment; #1. Acute cholangitis. Patient is status post ERCP with biliary stenting on 03/16/2013 because large calcified stones could not be removed. The Donnie CoffinRubin has dropped from over 10 to 3.3. Transaminases are also coming down AP is up slightly. GI symptoms have have resolved. #2. Distended gallbladder with stones. Degree of distention has decreased. I have gone over ultrasound with Dr. Daylene PoseyGellarani and he agrees. #3. Anemia. No evidence of GI bleed or evidence of iron B12 or for a deficiency. #4. CHF. Patient is now asymptomatic. Patient is 72 hours post-sphincterotomy and therefore okay to start on aspirin. #5. CKD. Renal function is improving.  Recommendations; H&H in a.m. Will transition to oral antibiotics in a.m. Repeat ERCP in 4-6 weeks.

## 2013-03-19 NOTE — Evaluation (Signed)
Physical Therapy Evaluation Patient Details Name: Erica Dunn MRN: [id number] DOB: 11/11/1920 Today's Date: 03/19/2013 Time: 6962-9528 PT Time Calculation (min): 42 min  PT Assessment / Plan / Recommendation History of Present Illness  This patient was admitted with nausea/abdominal pain and found to have choledolethiasis.  She underwent ERCP with removal of stones and placement of stent.  Post operatively, she developed acute respiratory failure and was placed on BiPap.  She has recovered from this event and is now seen for a PT evaluation.  Clinical Impression   This is a delightful pt who lives with her family and manages at home with assist of family with bathing and household activities.  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  She "furniture walks" at home and clearly needs 2 hand support with gait.  She would benefit from HHPT at d/c to work on developing gait stability with an assistive device.  Pt had no respiratory problems on RA.     PT Assessment  Patient needs continued PT services    Follow Up Recommendations  Home health PT    Does the patient have the potential to tolerate intense rehabilitation      Barriers to Discharge        Equipment Recommendations  None recommended by PT    Recommendations for Other Services     Frequency Min 3X/week    Precautions / Restrictions Precautions Precautions: None Restrictions Weight Bearing Restrictions: No   Pertinent Vitals/Pain       Mobility  Bed Mobility Overal bed mobility: Modified Independent Transfers Overall transfer level: Modified independent Equipment used: None Ambulation/Gait Ambulation/Gait assistance: Min assist Ambulation Distance (Feet): 20 Feet Assistive device: Rolling walker (2 wheeled) Gait Pattern/deviations: Trunk flexed Gait velocity: WNL General Gait Details: gait was quite hampered by ICU setting...pt clearly needs 2 hand support for gait...she has a  severe kyphosis which causes significant trunk flexion both in sitting and standing    Exercises     PT Diagnosis: Difficulty walking  PT Problem List: Decreased activity tolerance;Decreased mobility;Cardiopulmonary status limiting activity PT Treatment Interventions: Gait training;Therapeutic exercise;Stair training     PT Goals(Current goals can be found in the care plan section) Acute Rehab PT Goals Patient Stated Goal: none stated PT Goal Formulation: With patient Time For Goal Achievement: 04/02/13 Potential to Achieve Goals: Good  Visit Information  Last PT Received On: 03/19/13 History of Present Illness: This patient was admitted with nausea/abdominal pain and found to have choledolethiasis.  She underwent ERCP with removal of stones and placement of stent.  Post operatively, she developed acute respiratory failure and was placed on BiPap.  She has recovered from this event and is now seen for a PT evaluation.       Prior Functioning  Home Living Family/patient expects to be discharged to:: Private residence Living Arrangements: Spouse/significant other;Children Available Help at Discharge: Family;Available 24 hours/day Type of Home: House Home Access: Stairs to enter [employer] of Steps: 3 Entrance Stairs-Rails: Right Home Layout: One level Home Equipment: Walker - 4 wheels;Cane - single point Prior Function Level of Independence: Independent Comments: pt "furniture walks" and family has been urging her to use a walker Communication Communication: HOH    Cognition  Cognition Arousal/Alertness: Awake/alert Behavior During Therapy: WFL for tasks assessed/performed Overall Cognitive Status: Within Functional Limits for tasks assessed    Extremity/Trunk Assessment Lower Extremity Assessment Lower Extremity Assessment: Overall WFL for tasks assessed Cervical / Trunk Assessment Cervical /  Trunk Assessment: Kyphotic   Balance Balance Overall balance  assessment: No apparent balance deficits (not formally assessed)  End of Session PT - End of Session Equipment Utilized During Treatment: Gait belt Activity Tolerance: Patient tolerated treatment well Patient left: in chair;with call bell/phone within reach;with family/visitor present Nurse Communication: Mobility status  GP     Konrad PentaBrown, Myrikal Messmer L 03/19/2013, 3:04 PM

## 2013-03-19 NOTE — Progress Notes (Addendum)
Report called to 3rd floor RN. Pt to be transported to 304. @1500  foley dc'd.

## 2013-03-19 NOTE — Progress Notes (Signed)
Consulting cardiologist: Dr. Jonelle Sidle  Subjective:    Feeling well. No abdominal pain or dyspnea. Erica Dunn is eating and having BM's.  Objective:   Temp:  [98.1 F (36.7 C)-99.1 F (37.3 C)] 98.3 F (36.8 C) (01/22 0730) Pulse Rate:  [40-71] 67 (01/22 0900) Resp:  [16-28] 22 (01/22 0900) BP: (102-158)/(43-119) 109/78 mmHg (01/22 0800) SpO2:  [95 %-100 %] 98 % (01/22 0900) Weight:  [112 lb 10.5 oz (51.1 kg)] 112 lb 10.5 oz (51.1 kg) (01/22 0500) Last BM Date: 03/15/13  Filed Weights   03/17/13 0500 03/18/13 0500 03/19/13 0500  Weight: 107 lb 12.9 oz (48.9 kg) 111 lb 15.9 oz (50.8 kg) 112 lb 10.5 oz (51.1 kg)    Intake/Output Summary (Last 24 hours) at 03/19/13 1023 Last data filed at 03/19/13 0500  Gross per 24 hour  Intake    220 ml  Output   1451 ml  Net  -1231 ml    Telemetry: NSR.  Exam:  General: No acute distress.  Lungs: Clear to auscultation, nonlabored.  Cardiac: RRR, no gallop or rub.   Extremities: No pitting edema, distal pulses full.   Lab Results:  Basic Metabolic Panel:  Recent Labs Lab 03/17/13 0457 03/18/13 0449 03/19/13 0437  NA 144 140 141  K 3.8 3.5* 4.6  CL 106 105 106  CO2 26 27 26   GLUCOSE 79 80 80  BUN 25* 26* 22  CREATININE 1.65* 1.80* 1.58*  CALCIUM 7.9* 8.4 8.5    Liver Function Tests:  Recent Labs Lab 03/17/13 0457 03/18/13 0449 03/19/13 0437  AST 223* 155* 105*  ALT 72* 67* 54*  ALKPHOS 289* 342* 393*  BILITOT 5.9* 3.5* 3.2*  PROT 5.1* 5.2* 5.4*  ALBUMIN 1.8* 1.7* 1.8*    CBC:  Recent Labs Lab 03/16/13 0916 03/17/13 0437 03/18/13 0449  WBC 20.5* 11.8* 6.9  HGB 10.8* 8.7* 8.8*  HCT 31.9* 26.3* 26.3*  MCV 105.3* 105.6* 106.0*  PLT 261 182 190     Radiology: US Abdomen Limited Ruq  03/18/2013   CLINICAL DATA:  Gallstones.  EXAM: US ABDOMEN LIMITED - RIGHT UPPER QUADRANT  COMPARISON:  CT 03/16/2013.  FINDINGS: Gallbladder:  Multiple mobile gallstones. Gallbladder wall thickness 5.7 mm.  Gallbladder is distended.  Common bile duct:  Diameter: 10.8 mm. A biliary stent is noted. Probable common bile duct stones present in the distal common bile duct. , image 46. This measures 9 mm. Intrahepatic biliary ductal distention also present.  Liver:  Multiple calcifications noted throughout the liver and spleen consistent with granulomas.  Right pleural effusion is noted.  IMPRESSION: 1. Distended gallbladder with multiple gallstones and gallbladder wall thickening. Cholecystitis cannot be excluded. 2. Intrahepatic and extrahepatic biliary distention. A biliary stent is noted. 9 mm distal common bile duct stone noted. 3. Right pleural effusion.   Electronically Signed   By: Maisie Fus  Register   On: 03/18/2013 15:41     Medications:   Scheduled Medications: . acyclovir  800 mg Oral BID  . aspirin EC  81 mg Oral Daily  . beta carotene w/minerals  1 tablet Oral BID  . docusate sodium  100 mg Oral BID  . furosemide  20 mg Oral Daily  . levothyroxine  50 mcg Oral QAC breakfast  . piperacillin-tazobactam (ZOSYN)  IV  2.25 g Intravenous Q8H  . potassium chloride  40 mEq Oral BID  . prednisoLONE acetate  1 drop Left Eye Daily  . timolol  1 drop Right Eye Daily   :  PRN Medications:  fentaNYL, hydrALAZINE, morphine injection, naLOXone (NARCAN)  injection, ondansetron (ZOFRAN) IV, ondansetron, sodium chloride   Assessment and Plan:   1. Diastolic CHF: Erica Dunn is breathing well and has not evidence of fluid retention in the LE. Erica Dunn will continue on low dose PO lasix 20 mg daily. Erica Dunn is not on any antihypertensives at this time with good control of BP. Continue ASA.  Echocardiogram on 03/17/13 demonstrated normal EF with Grade I diastolic dysfunction.  Will follow with you. No planned cardiac testing at this time.  2. Cholecystitis with Cholelithiasis: S/P ERCP with biliary stent placement on 03/16/2013. Liver enzymes are improving. Erica Dunn is without complaint of abdominal pain. Eating and having BM.  Need for percutaneous drain decision per surgery based on symptoms and labs.   Bettey MareKathryn M. Lyman BishopLawrence NP [facility] 03/19/2013, 10:23 AM   Attending note:  Patient seen and examined. Modified above note by Ms. Lawrence NP. Erica Dunn remains stable from a cardiac perspective. Medications include aspirin and low-dose oral Lasix. Bradycardia and low normal blood pressure limit further medication titration at this time. Would suspect underlying ischemic heart disease, although manage conservatively at this point.  Jonelle SidleSamuel G. McDowell, M.D., F.A.C.C.

## 2013-03-19 NOTE — Progress Notes (Signed)
[facility] PROGRESS NOTE  Erica Dunn WNU:[id number] DOB: 1920-05-13 DOA: 03/15/2013 PCP: Renee Rival, MD  Assessment/Plan: 80 year old female with history of , HTN, hypothyroidism, GERD came to the hospital with worsening abdominal pain for past one week found to have cholelithiasis, cholangitis, sepsis s/p ERCP developed respiratory failure resolved on BiPAP   1. Sepsis/hypotension/cholangitis; CT: distended gallbladder, hepatic duct, cholelithiasis  -s/p ERCP 1/19: stent laserbiliary decompression -cont IV atx, antiemetics, appreciate, GI, surgery eval; diet per GI  - repeat US abdomen  showed distended GB with multiple gall stone and GB wall thickening.     2. Leukocytosis likely due to above; cont IV atx; blood c/s: NGTD. Resolved.   3. Acute respiratory failure multifactorial due to opioid/benzo +CHF; code blue called on 1/19; patient in respiratory distress, but hemodynamically stable;  -resolved on BiPAP/naloxone/flumazenil+lasix IV; cont prn bronchodilators, oxygen, . Echo cardiogram revealed diastolic dysfunction. With elevated troponin yesterday and abnormal EKG , consulted cardiology for further recommendations. Her breathing has improved and she is not in distress any more. She also denies any chest pain or sob.   4. Acute diastolic CHF; unclear history; CRX: pulmonary edema, S3;  -improving on IV lasix;  Change to po lasix, cont diuresis prn as BP allows;  -mild positive trop possible stress related vs CKD vs demand ischemia; f/u troponin's are negative.  - renal parameters better.   5. Abnormal LFTs, alk phos due to #1;transaminases are improving, but alk phos is increasing . [place] ABD showed distended GB with multiple gall stone and GB wall thickening.   6. HTN currently hypotensive, hold ACE; prn hydralazine if hypertensive.   7. Hypothyroidism, cont levothyroxine; tsh within normal limits.  8. Acute anemia r/o blood loss; check occult blood; Tf prn; cbc in am.    9. Stage 3 CKD: improving.    Code Status: DNR Family Communication: d/w patient, family not at bedside.  Disposition Plan: transfer pt to telemetry. PT evaluation.    Consultants:  GI  cardiology  Procedures:  Pend ? ERCP  Antibiotics:  Zosyn 1/19<<<   (indicate start date, and stop date if known)  HPI/Subjective: Alert, denies chest pain or sob. Had one BM this am. Still on 2 lit  oxygen.   Objective: Filed Vitals:   03/19/13 0900  BP:   Pulse: 67  Temp:   Resp: 22    Intake/Output Summary (Last 24 hours) at 03/19/13 0932 Last data filed at 03/19/13 0500  Gross per 24 hour  Intake    220 ml  Output   1451 ml  Net  -1231 ml   Filed Weights   03/17/13 0500 03/18/13 0500 03/19/13 0500  Weight: 48.9 kg (107 lb 12.9 oz) 50.8 kg (111 lb 15.9 oz) 51.1 kg (112 lb 10.5 oz)    Exam:   General:  Alert, afebrile comfortable.   Cardiovascular: s1,s2 rrr  Respiratory: ctab. Scattered rales at bases.   Abdomen: soft, nt, nd   Musculoskeletal: no LE edema   Data Reviewed: Basic Metabolic Panel:  Recent Labs Lab 03/15/13 2230 03/16/13 0916 03/17/13 0457 03/18/13 0449 03/19/13 0437  NA 140 142 144 140 141  K 4.2 3.9 3.8 3.5* 4.6  CL 94* 101 106 105 106  CO2 '29 26 26 27 26  ' GLUCOSE 169* 134* 79 80 80  BUN 20 21 25* 26* 22  CREATININE 1.66* 1.55* 1.65* 1.80* 1.58*  CALCIUM 10.1 8.5 7.9* 8.4 8.5   Liver Function Tests:  Recent Labs Lab 03/15/13 2230 03/16/13  8588 03/17/13 0457 2013/03/28 0449 03/19/13 0437  AST 206* 157* 223* 155* 105*  ALT 88* 68* 72* 67* 54*  ALKPHOS 573* 438* 289* 342* 393*  BILITOT 11.8* 10.7* 5.9* 3.5* 3.2*  PROT 8.3 6.8 5.1* 5.2* 5.4*  ALBUMIN 3.2* 2.4* 1.8* 1.7* 1.8*    Recent Labs Lab 03/15/13 2230 03/17/13 0437  LIPASE 27  --   AMYLASE  --  28   No results found for this basename: AMMONIA,  in the last 168 hours CBC:  Recent Labs Lab 03/15/13 2230 03/16/13 0916 03/17/13 0437 03/28/13 0449  WBC  18.2* 20.5* 11.8* 6.9  NEUTROABS 16.5*  --   --   --   HGB 12.5 10.8* 8.7* 8.8*  HCT 36.4 31.9* 26.3* 26.3*  MCV 104.6* 105.3* 105.6* 106.0*  PLT 340 261 182 190   Cardiac Enzymes:  Recent Labs Lab 03/17/13 0914 03/17/13 2240  TROPONINI 0.38* <0.30   BNP (last 3 results)  Recent Labs  03/16/13 0900  PROBNP 7350.0*   CBG:  Recent Labs Lab 03/17/13 1127  GLUCAP 109*    Recent Results (from the past 240 hour(s))  CULTURE, BLOOD (ROUTINE X 2)     Status: None   Collection Time    03/16/13  9:16 AM      Result Value Range Status   Specimen Description BLOOD LEFT FOREARM   Final   Special Requests BOTTLES DRAWN AEROBIC AND ANAEROBIC 5CC   Final   Culture NO GROWTH 2 DAYS   Final   Report Status PENDING   Incomplete  CULTURE, BLOOD (ROUTINE X 2)     Status: None   Collection Time    03/16/13  9:16 AM      Result Value Range Status   Specimen Description BLOOD LEFT ANTECUBITAL   Final   Special Requests BOTTLES DRAWN AEROBIC AND ANAEROBIC 6CC   Final   Culture NO GROWTH 2 DAYS   Final   Report Status PENDING   Incomplete  SURGICAL PCR SCREEN     Status: None   Collection Time    03/16/13  9:27 AM      Result Value Range Status   MRSA, PCR NEGATIVE  NEGATIVE Final   Staphylococcus aureus NEGATIVE  NEGATIVE Final   Comment:            The Xpert SA Assay (FDA     approved for NASAL specimens     in patients over 24 years of age),     is one component of     a comprehensive surveillance     program.  Test performance has     been validated by [employer] for patients greater     than or equal to 1 year old.     It is not intended     to diagnose infection nor to     guide or monitor treatment.     Studies: US Abdomen Limited Ruq  03-28-2013   CLINICAL DATA:  Gallstones.  EXAM: US ABDOMEN LIMITED - RIGHT UPPER QUADRANT  COMPARISON:  CT 03/16/2013.  FINDINGS: Gallbladder:  Multiple mobile gallstones. Gallbladder wall thickness 5.7 mm. Gallbladder is  distended.  Common bile duct:  Diameter: 10.8 mm. A biliary stent is noted. Probable common bile duct stones present in the distal common bile duct. , image 46. This measures 9 mm. Intrahepatic biliary ductal distention also present.  Liver:  Multiple calcifications noted throughout the liver and spleen consistent with granulomas.  Right pleural effusion is noted.  IMPRESSION: 1. Distended gallbladder with multiple gallstones and gallbladder wall thickening. Cholecystitis cannot be excluded. 2. Intrahepatic and extrahepatic biliary distention. A biliary stent is noted. 9 mm distal common bile duct stone noted. 3. Right pleural effusion.   Electronically Signed   By: Marcello Moores  Register   On: 03/18/2013 15:41    Scheduled Meds: . acyclovir  800 mg Oral BID  . aspirin EC  81 mg Oral Daily  . beta carotene w/minerals  1 tablet Oral BID  . docusate sodium  100 mg Oral BID  . furosemide  20 mg Oral Daily  . levothyroxine  50 mcg Oral QAC breakfast  . piperacillin-tazobactam (ZOSYN)  IV  2.25 g Intravenous Q8H  . potassium chloride  40 mEq Oral BID  . prednisoLONE acetate  1 drop Left Eye Daily  . timolol  1 drop Right Eye Daily   Continuous Infusions:    Active Problems:   Choledocholithiasis with obstruction   Choledocholithiasis   HTN (hypertension)   Acute diastolic heart failure    Time spent: >35 minutes     [facility] Pager [phone]. If 7PM-7AM, please contact night-coverage at [email or web address], password [facility] 03/19/2013, 9:32 AM  LOS: 4 days

## 2013-03-20 LAB — COMPREHENSIVE METABOLIC PANEL
ALBUMIN: 1.9 g/dL — AB (ref 3.5–5.2)
ALK PHOS: 359 U/L — AB (ref 39–117)
ALT: 42 U/L — ABNORMAL HIGH (ref 0–35)
AST: 72 U/L — ABNORMAL HIGH (ref 0–37)
BILIRUBIN TOTAL: 3.1 mg/dL — AB (ref 0.3–1.2)
BUN: 17 mg/dL (ref 6–23)
CHLORIDE: 104 meq/L (ref 96–112)
CO2: 27 mEq/L (ref 19–32)
Calcium: 8.9 mg/dL (ref 8.4–10.5)
Creatinine, Ser: 1.47 mg/dL — ABNORMAL HIGH (ref 0.50–1.10)
GFR calc non Af Amer: 30 mL/min — ABNORMAL LOW (ref >90)
GFR, EST AFRICAN AMERICAN: 35 mL/min — AB (ref >90)
GLUCOSE: 92 mg/dL (ref 70–99)
POTASSIUM: 5 meq/L (ref 3.7–5.3)
Sodium: 140 mEq/L (ref 137–147)
Total Protein: 5.6 g/dL — ABNORMAL LOW (ref 6.0–8.3)

## 2013-03-20 LAB — CBC
HEMATOCRIT: 30.1 % — AB (ref 36.0–46.0)
HEMOGLOBIN: 9.8 g/dL — AB (ref 12.0–15.0)
MCH: 35.4 pg — ABNORMAL HIGH (ref 26.0–34.0)
MCHC: 32.6 g/dL (ref 30.0–36.0)
MCV: 108.7 fL — ABNORMAL HIGH (ref 78.0–100.0)
Platelets: 220 10*3/uL (ref 150–400)
RBC: 2.77 MIL/uL — ABNORMAL LOW (ref 3.87–5.11)
RDW: 16.7 % — ABNORMAL HIGH (ref 11.5–15.5)
WBC: 14.1 10*3/uL — AB (ref 4.0–10.5)

## 2013-03-20 MED ORDER — ENSURE COMPLETE PO LIQD
237.0000 mL | Freq: Two times a day (BID) | ORAL | Status: DC
  Administered 2013-03-20 – 2013-03-22 (×4): 237 mL via ORAL

## 2013-03-20 MED ORDER — LEVOFLOXACIN 250 MG PO TABS
250.0000 mg | ORAL_TABLET | Freq: Every day | ORAL | Status: DC
  Administered 2013-03-20 – 2013-03-22 (×3): 250 mg via ORAL
  Filled 2013-03-20 (×2): qty 1

## 2013-03-20 NOTE — Progress Notes (Addendum)
INITIAL NUTRITION ASSESSMENT  DOCUMENTATION CODES Per approved criteria  -Not Applicable   INTERVENTION: Ensure Complete po BID, each supplement provides 350 kcal and 13 grams of protein   NUTRITION DIAGNOSIS: Inadequate oral intake related to acute illness as evidenced by cholangitis, NPO x 3 days.     Goal: Pt to meet >/= 90% of their estimated nutrition needs    Monitor:  Po intake, labs and wt trends   Reason for Assessment: Malnutrition Screen Score =  2   80 y.o. female  Patient Active Problem List   Diagnosis Date Noted  . Acute diastolic heart failure 03/18/2013  . Choledocholithiasis with obstruction 03/16/2013  . Choledocholithiasis 03/16/2013  . HTN (hypertension) 03/16/2013    ASSESSMENT: pt is 80 yo FM who is s/p ERCP with biliary stent placement (03/16/13) secondary to acute cholangitis. Her weight hx is stable and appetite is good. Feeds herself. Her diet has advanced and po intake good (100% breakfast). Home diet is Regular and she drinks an Ensure daily (chocolate). She is at risk for malnutrition with her advanced age and acute illness.  Height: Ht Readings from Last 1 Encounters:  03/16/13 5' (1.524 m)    Weight: Wt Readings from Last 1 Encounters:  03/19/13 112 lb 10.5 oz (51.1 kg)    Ideal Body Weight: 100# (45.4 kg)  % Ideal Body Weight: 112%  Wt Readings from Last 10 Encounters:  03/19/13 112 lb 10.5 oz (51.1 kg)  03/19/13 112 lb 10.5 oz (51.1 kg)    Usual Body Weight: 110-114#  % Usual Body Weight: 100%  BMI:  Body mass index is 22 kg/(m^2).normal range  Estimated Nutritional Needs: Kcal: 1275-1530 Protein: 56-66 gr Fluid: 1.3-1.5 liters daily  Skin: intact  Diet Order: Cardiac po-100% dinner last night  EDUCATION NEEDS: -No education needs identified at this time   Intake/Output Summary (Last 24 hours) at 03/20/13 0936 Last data filed at 03/20/13 0641  Gross per 24 hour  Intake    390 ml  Output   1051 ml  Net   -661 ml     Last BM: 03/19/13  Labs:   Recent Labs Lab 03/18/13 0449 03/19/13 0437 03/20/13 0455  NA 140 141 140  K 3.5* 4.6 5.0  CL 105 106 104  CO2 27 26 27   BUN 26* 22 17  CREATININE 1.80* 1.58* 1.47*  CALCIUM 8.4 8.5 8.9  GLUCOSE 80 80 92    CBG (last 3)   Recent Labs  03/17/13 1127  GLUCAP 109*    Scheduled Meds: . acyclovir  800 mg Oral BID  . aspirin EC  81 mg Oral Daily  . beta carotene w/minerals  1 tablet Oral BID  . docusate sodium  100 mg Oral BID  . furosemide  20 mg Oral Daily  . levofloxacin  250 mg Oral Daily  . levothyroxine  50 mcg Oral QAC breakfast  . potassium chloride  40 mEq Oral BID  . prednisoLONE acetate  1 drop Left Eye Daily  . timolol  1 drop Right Eye Daily    Continuous Infusions:   Past Medical History  Diagnosis Date  . GERD (gastroesophageal reflux disease)   . Hyperthyroidism   . Essential hypertension, benign   . Aortic atherosclerosis     Documented by CT imaging    Past Surgical History  Procedure Laterality Date  . Breast surgery Left     60 years ago  . Abdominal hysterectomy    . Ercp  03/16/2013  Procedure: ENDOSCOPIC RETROGRADE CHOLANGIOPANCREATOGRAPHY (ERCP) (Duct not cleared of all stones);  Surgeon: Malissa Hippo, MD;  Location: [facility];  Service: Endoscopy;;  . Sphincterotomy  03/16/2013    Procedure: Dennison Mascot;  Surgeon: Malissa Hippo, MD;  Location: [facility];  Service: Endoscopy;;  . Balloon dilation  03/16/2013    Procedure: Marvis Repress DILATION;  Surgeon: Malissa Hippo, MD;  Location: [facility];  Service: Endoscopy;;  . Removal of stones  03/16/2013    Procedure: REMOVAL OF STONES WITH STONE BASKET;  Surgeon: Malissa Hippo, MD;  Location: [facility];  Service: Endoscopy;;  . Biliary stent placement  03/16/2013    Procedure: BILIARY STENT PLACEMENT;  Surgeon: Malissa Hippo, MD;  Location: [facility];  Service: Endoscopy;;    Royann Shivers MS,RD,CSG,LDN Office: [phone] Pager: [phone]

## 2013-03-20 NOTE — Progress Notes (Signed)
[facility] PROGRESS NOTE  Erica Dunn ZTI:[id number] DOB: [birth year] DOA: 03/15/2013 PCP: Renee Rival, MD  Assessment/Plan: 80 year old female with history of , HTN, hypothyroidism, GERD came to the hospital with worsening abdominal pain for past one week found to have cholelithiasis, cholangitis, sepsis s/p ERCP developed respiratory failure resolved on BiPAP   1. Sepsis/hypotension/cholangitis; CT: distended gallbladder, hepatic duct, cholelithiasis  -s/p ERCP 1/19: stent laserbiliary decompression -cont antiemetics, appreciate, GI, surgery eval; diet per GI  - repeat US abdomen  showed distended GB with multiple gall stone and GB wall thickening.  - IV antibiotics transitioned to po levaquin.     2. Leukocytosis likely due to above; cont IV atx; blood c/s: NGTD.  Rpt in am.   3. Acute respiratory failure multifactorial due to opioid/benzo +CHF; code blue called on 1/19; patient in respiratory distress, but hemodynamically stable;  -resolved on BiPAP/naloxone/flumazenil+lasix IV; cont prn bronchodilators, oxygen, . Echo cardiogram revealed diastolic dysfunction. With elevated troponin yesterday and abnormal EKG , consulted cardiology for further recommendations. Her breathing has improved and she is not in distress any more. She also denies any chest pain or sob.   4. Acute diastolic CHF; unclear history; CRX: pulmonary edema, S3;  -improving on IV lasix;  Change to po lasix, cont diuresis prn as BP allows;  -mild positive trop possible stress related vs CKD vs demand ischemia; f/u troponin's are negative.  - renal parameters better.   5. Abnormal LFTs, alk phos due to #1;transaminases are improving, but alk phos is increasing . [place] ABD showed distended GB with multiple gall stone and GB wall thickening.   6. HTN currently hypotensive, hold ACE; prn hydralazine if hypertensive.   7. Hypothyroidism, cont levothyroxine; tsh within normal limits.  8. Acute anemia r/o blood  loss; check occult blood; Tf prn; cbc in am.   9. Stage 3 CKD: improving.    Code Status: DNR Family Communication: d/w patient, family not at bedside.  Disposition Plan: transfer pt to telemetry. PT evaluation.    Consultants:  GI  cardiology  Procedures:  Pend ? ERCP  Antibiotics:  Zosyn 1/19<<<1/22   (indicate start date, and stop date if known)  HPI/Subjective: Alert, denies chest pain or sob. Had one BM this am.  Objective: Filed Vitals:   03/20/13 0645  BP: 108/46  Pulse: 75  Temp: 98.9 F (37.2 C)  Resp: 20    Intake/Output Summary (Last 24 hours) at 03/20/13 1715 Last data filed at 03/20/13 1012  Gross per 24 hour  Intake    580 ml  Output   1051 ml  Net   -471 ml   Filed Weights   03/17/13 0500 03/18/13 0500 03/19/13 0500  Weight: 48.9 kg (107 lb 12.9 oz) 50.8 kg (111 lb 15.9 oz) 51.1 kg (112 lb 10.5 oz)    Exam:   General:  Alert, afebrile comfortable.   Cardiovascular: s1,s2 rrr  Respiratory: ctab. Scattered rales at bases.   Abdomen: soft, nt, nd   Musculoskeletal: no LE edema   Data Reviewed: Basic Metabolic Panel:  Recent Labs Lab 03/16/13 0916 03/17/13 0457 03/18/13 0449 03/19/13 0437 03/20/13 0455  NA 142 144 140 141 140  K 3.9 3.8 3.5* 4.6 5.0  CL 101 106 105 106 104  CO2 '26 26 27 26 27  ' GLUCOSE 134* 79 80 80 92  BUN 21 25* 26* 22 17  CREATININE 1.55* 1.65* 1.80* 1.58* 1.47*  CALCIUM 8.5 7.9* 8.4 8.5 8.9   Liver Function Tests:  Recent Labs Lab 03/16/13 0916 03/17/13 0457 03/18/13 0449 03/19/13 0437 03/20/13 0455  AST 157* 223* 155* 105* 72*  ALT 68* 72* 67* 54* 42*  ALKPHOS 438* 289* 342* 393* 359*  BILITOT 10.7* 5.9* 3.5* 3.2* 3.1*  PROT 6.8 5.1* 5.2* 5.4* 5.6*  ALBUMIN 2.4* 1.8* 1.7* 1.8* 1.9*    Recent Labs Lab 03/15/13 2230 03/17/13 0437  LIPASE 27  --   AMYLASE  --  28   No results found for this basename: AMMONIA,  in the last 168 hours CBC:  Recent Labs Lab 03/15/13 2230  03/16/13 0916 03/17/13 0437 03/18/13 0449 03/20/13 0455  WBC 18.2* 20.5* 11.8* 6.9 14.1*  NEUTROABS 16.5*  --   --   --   --   HGB 12.5 10.8* 8.7* 8.8* 9.8*  HCT 36.4 31.9* 26.3* 26.3* 30.1*  MCV 104.6* 105.3* 105.6* 106.0* 108.7*  PLT 340 261 182 190 220   Cardiac Enzymes:  Recent Labs Lab 03/17/13 0914 03/17/13 2240  TROPONINI 0.38* <0.30   BNP (last 3 results)  Recent Labs  03/16/13 0900  PROBNP 7350.0*   CBG:  Recent Labs Lab 03/17/13 1127  GLUCAP 109*    Recent Results (from the past 240 hour(s))  CULTURE, BLOOD (ROUTINE X 2)     Status: None   Collection Time    03/16/13  9:16 AM      Result Value Range Status   Specimen Description BLOOD LEFT FOREARM   Final   Special Requests BOTTLES DRAWN AEROBIC AND ANAEROBIC 5CC   Final   Culture NO GROWTH 4 DAYS   Final   Report Status PENDING   Incomplete  CULTURE, BLOOD (ROUTINE X 2)     Status: None   Collection Time    03/16/13  9:16 AM      Result Value Range Status   Specimen Description BLOOD LEFT ANTECUBITAL   Final   Special Requests BOTTLES DRAWN AEROBIC AND ANAEROBIC 6CC   Final   Culture NO GROWTH 4 DAYS   Final   Report Status PENDING   Incomplete  SURGICAL PCR SCREEN     Status: None   Collection Time    03/16/13  9:27 AM      Result Value Range Status   MRSA, PCR NEGATIVE  NEGATIVE Final   Staphylococcus aureus NEGATIVE  NEGATIVE Final   Comment:            The Xpert SA Assay (FDA     approved for NASAL specimens     in patients over 79 years of age),     is one component of     a comprehensive surveillance     program.  Test performance has     been validated by [employer] for patients greater     than or equal to 24 year old.     It is not intended     to diagnose infection nor to     guide or monitor treatment.     Studies: Dg Chest Port 1 View  03/19/2013   CLINICAL DATA:  Assess the pulmonary interstitium for residual interstitial edema  EXAM: PORTABLE CHEST - 1 VIEW   COMPARISON:  Portable chest x-ray of 16 March 2013  FINDINGS: The lungs are adequately inflated. The interstitial markings are mildly increased but have improved since the previous study. The left hemidiaphragm is now faintly visible as is a portion of the right hemidiaphragm. There remains blunting of the  right lateral costophrenic gutter. The cardiac silhouette is top-normal in size. The pulmonary vascularity is less prominent today. There is tortuosity of the descending thoracic aorta. The observed portions of the bony thorax exhibit no acute abnormalities.  IMPRESSION: 1. There has been interval improvement in the appearance of the pulmonary interstitium consistent with resolving edema. 2. The bilateral pleural effusions have decreased in size. Only a small amount of fluid is now visible on the right. 3. The cardiac silhouette remains mildly enlarged in the central pulmonary vascularity remains prominent but these findings are less conspicuous than on the previous study.   Electronically Signed   By: David  Martinique   On: 03/19/2013 13:38    Scheduled Meds: . acyclovir  800 mg Oral BID  . aspirin EC  81 mg Oral Daily  . beta carotene w/minerals  1 tablet Oral BID  . docusate sodium  100 mg Oral BID  . feeding supplement (ENSURE COMPLETE)  237 mL Oral BID BM  . furosemide  20 mg Oral Daily  . levofloxacin  250 mg Oral Daily  . levothyroxine  50 mcg Oral QAC breakfast  . potassium chloride  40 mEq Oral BID  . prednisoLONE acetate  1 drop Left Eye Daily  . timolol  1 drop Right Eye Daily   Continuous Infusions:    Active Problems:   Choledocholithiasis with obstruction   Choledocholithiasis   HTN (hypertension)   Acute diastolic heart failure    Time spent: >35 minutes     [facility] Pager [phone]. If 7PM-7AM, please contact night-coverage at [email or web address], password [facility] 03/20/2013, 5:15 PM  LOS: 5 days

## 2013-03-20 NOTE — Progress Notes (Signed)
ANTIBIOTIC CONSULT NOTE   Pharmacy Consult for Zosyn Indication: Choledocholithiasis  No Known Allergies  Patient Measurements: Height: 5' (152.4 cm) Weight: 112 lb 10.5 oz (51.1 kg) IBW/kg (Calculated) : 45.5  Vital Signs: Temp: 98.9 F (37.2 C) (01/23 0645) Temp src: Oral (01/23 0645) BP: 108/46 mmHg (01/23 0645) Pulse Rate: 75 (01/23 0645) Intake/Output from previous day: 01/22 0701 - 01/23 0700 In: 390 [P.O.:240; IV Piggyback:150] Out: 1051 [Urine:1050; Stool:1] Intake/Output from this shift:    Labs:  Recent Labs  03/18/13 0449 03/19/13 0437 03/20/13 0455  WBC 6.9  --  14.1*  HGB 8.8*  --  9.8*  PLT 190  --  220  CREATININE 1.80* 1.58* 1.47*   Estimated Creatinine Clearance: 18.3 ml/min (by C-G formula based on Cr of 1.47). No results found for this basename: VANCOTROUGH, Leodis BinetVANCOPEAK, VANCORANDOM, GENTTROUGH, GENTPEAK, GENTRANDOM, TOBRATROUGH, TOBRAPEAK, TOBRARND, AMIKACINPEAK, AMIKACINTROU, AMIKACIN,  in the last 72 hours   Microbiology: Recent Results (from the past 720 hour(s))  CULTURE, BLOOD (ROUTINE X 2)     Status: None   Collection Time    03/16/13  9:16 AM      Result Value Range Status   Specimen Description BLOOD LEFT FOREARM   Final   Special Requests BOTTLES DRAWN AEROBIC AND ANAEROBIC 5CC   Final   Culture NO GROWTH 4 DAYS   Final   Report Status PENDING   Incomplete  CULTURE, BLOOD (ROUTINE X 2)     Status: None   Collection Time    03/16/13  9:16 AM      Result Value Range Status   Specimen Description BLOOD LEFT ANTECUBITAL   Final   Special Requests BOTTLES DRAWN AEROBIC AND ANAEROBIC 6CC   Final   Culture NO GROWTH 4 DAYS   Final   Report Status PENDING   Incomplete  SURGICAL PCR SCREEN     Status: None   Collection Time    03/16/13  9:27 AM      Result Value Range Status   MRSA, PCR NEGATIVE  NEGATIVE Final   Staphylococcus aureus NEGATIVE  NEGATIVE Final   Comment:            The Xpert SA Assay (FDA     approved for NASAL  specimens     in patients over 78 years of age),     is one component of     a comprehensive surveillance     program.  Test performance has     been validated by [facility] for patients greater     than or equal to 78 year old.     It is not intended     to diagnose infection nor to     guide or monitor treatment.   Medical History: Past Medical History  Diagnosis Date  . GERD (gastroesophageal reflux disease)   . Hyperthyroidism   . Essential hypertension, benign   . Aortic atherosclerosis     Documented by CT imaging   Medications:  Scheduled:  . acyclovir  800 mg Oral BID  . aspirin EC  81 mg Oral Daily  . beta carotene w/minerals  1 tablet Oral BID  . docusate sodium  100 mg Oral BID  . furosemide  20 mg Oral Daily  . levothyroxine  50 mcg Oral QAC breakfast  . piperacillin-tazobactam (ZOSYN)  IV  2.25 g Intravenous Q8H  . potassium chloride  40 mEq Oral BID  . prednisoLONE acetate  1 drop  Left Eye Daily  . timolol  1 drop Right Eye Daily   Assessment: 80 yo F admitted with choledocholithiasis.  She was empirically started on Zosyn for elevated WBC.   Acute kidney injury likely from dehydration per MD note, however current estimated CrCl < 20 ml/min.  SCr continues to gradually improve.  Afebrile.  Zosyn 1/19>>  Goal of Therapy:  Eradicate infection.  Plan:  Zosyn 2.25gm IV q8h Monitor renal function and cx data   Valrie Hart A 03/20/2013,7:48 AM

## 2013-03-20 NOTE — Progress Notes (Signed)
Consulting cardiologist: Dr. Jonelle SidleSamuel G. Rakwon Letourneau  Subjective:    Eating breakfast. No chest pain or shortness of breath.  Objective:   Temp:  [97.9 F (36.6 C)-98.9 F (37.2 C)] 98.9 F (37.2 C) (01/23 0645) Pulse Rate:  [25-75] 75 (01/23 0645) Resp:  [19-25] 20 (01/23 0645) BP: (108-161)/(42-76) 108/46 mmHg (01/23 0645) SpO2:  [79 %-100 %] 98 % (01/23 0645) Last BM Date: 03/19/13  Filed Weights   03/17/13 0500 03/18/13 0500 03/19/13 0500  Weight: 107 lb 12.9 oz (48.9 kg) 111 lb 15.9 oz (50.8 kg) 112 lb 10.5 oz (51.1 kg)    Intake/Output Summary (Last 24 hours) at 03/20/13 0910 Last data filed at 03/20/13 0641  Gross per 24 hour  Intake    390 ml  Output   1051 ml  Net   -661 ml    Telemetry: Sinus rhythm.  Exam:  General: No acute distress.  Lungs: Clear to auscultation, nonlabored.  Cardiac: RRR, no gallop or rub.   Extremities: No pitting edema, distal pulses full.   Lab Results:  Basic Metabolic Panel:  Recent Labs Lab 03/18/13 0449 03/19/13 0437 03/20/13 0455  NA 140 141 140  K 3.5* 4.6 5.0  CL 105 106 104  CO2 27 26 27   GLUCOSE 80 80 92  BUN 26* 22 17  CREATININE 1.80* 1.58* 1.47*  CALCIUM 8.4 8.5 8.9    Liver Function Tests:  Recent Labs Lab 03/18/13 0449 03/19/13 0437 03/20/13 0455  AST 155* 105* 72*  ALT 67* 54* 42*  ALKPHOS 342* 393* 359*  BILITOT 3.5* 3.2* 3.1*  PROT 5.2* 5.4* 5.6*  ALBUMIN 1.7* 1.8* 1.9*    CBC:  Recent Labs Lab 03/17/13 0437 03/18/13 0449 03/20/13 0455  WBC 11.8* 6.9 14.1*  HGB 8.7* 8.8* 9.8*  HCT 26.3* 26.3* 30.1*  MCV 105.6* 106.0* 108.7*  PLT 182 190 220     Medications:   Scheduled Medications: . acyclovir  800 mg Oral BID  . aspirin EC  81 mg Oral Daily  . beta carotene w/minerals  1 tablet Oral BID  . docusate sodium  100 mg Oral BID  . furosemide  20 mg Oral Daily  . levothyroxine  50 mcg Oral QAC breakfast  . piperacillin-tazobactam (ZOSYN)  IV  2.25 g Intravenous Q8H  .  potassium chloride  40 mEq Oral BID  . prednisoLONE acetate  1 drop Left Eye Daily  . timolol  1 drop Right Eye Daily   :   PRN Medications: hydrALAZINE, morphine injection, naLOXone (NARCAN)  injection, ondansetron (ZOFRAN) IV, ondansetron, sodium chloride   Assessment and Plan:   1. Episode of probable acute diastolic heart failure. May have been some contribution from sedative medications with ERCP and respiratory depression, although still suspect underlying ischemic heart disease as well. LVEF demonstrates preserved LVEF with distal anteroseptal mild hypokinesis. She has improved clinically and chest x-ray looks better. No chest pain.  2. Presentation with cholelithiasis and cholangitis, status post ERCP with biliary stent. Being followed by GI with clinical improvement.  3. History of hypertension, blood pressure stable. She is not on any antihypertensive medications at this time.  4. Acute on presumably chronic renal insufficiency, creatinine down to 1.4.   Discussed with patient, also reviewed case with Dr. Karilyn Cotaehman. Further GI testing not anticipated at this particular time, may have a followup ERCP over the next several weeks. From a cardiac perspective would continue aspirin and low-dose Lasix with potassium supplements. For now no beta  blocker or ACE inhibitor/ARB with relatively low heart rates and blood pressure at baseline, also renal insufficiency. Patient does not want to pursue further cardiac testing. Would recommend that she follow up with her primary care provider after discharge. We can certainly see her back if the need arises.  Jonelle Sidle, M.D., F.A.C.C.

## 2013-03-20 NOTE — Progress Notes (Signed)
Physical Therapy Treatment Patient Details Name: Erica Dunn MRN: [id number] DOB: 02/18/1921 Today's Date: 03/20/2013 Time: [id number] PT Time Calculation (min): 40 min  PT Assessment / Plan / Recommendation  History of Present Illness Pt has no c/o   PT Comments   Pt is alert and very cooperative.  She demonstrates increased gait endurance today using a walker.  She should be able to transition to home at d/c.  Follow Up Recommendations        Does the patient have the potential to tolerate intense rehabilitation     Barriers to Discharge        Equipment Recommendations       Recommendations for Other Services    Frequency     Progress towards PT Goals Progress towards PT goals: Progressing toward goals  Plan Current plan remains appropriate    Precautions / Restrictions     Pertinent Vitals/Pain     Mobility  Bed Mobility Overal bed mobility: Modified Independent Transfers Overall transfer level: Modified independent Equipment used: None Ambulation/Gait Ambulation/Gait assistance: Min assist Ambulation Distance (Feet): 40 Feet Assistive device: Rolling walker (2 wheeled) Gait Pattern/deviations: Trunk flexed Gait velocity: WNL    Exercises General Exercises - Lower Extremity Ankle Circles/Pumps: AROM;Both;10 reps;Supine Quad Sets: AROM;Both;10 reps;Supine Gluteal Sets: AROM;Both;10 reps;Supine Heel Slides: AROM;Both;10 reps;Supine Hip ABduction/ADduction: AROM;Both;10 reps;Supine Straight Leg Raises: AROM;Both;10 reps;Supine   PT Diagnosis:    PT Problem List:   PT Treatment Interventions:     PT Goals (current goals can now be found in the care plan section)    Visit Information  Last PT Received On: 03/20/13 History of Present Illness: Pt has no c/o    Subjective Data      Cognition       Balance     End of Session PT - End of Session Equipment Utilized During Treatment: Gait belt Activity Tolerance: Patient tolerated treatment  well Patient left: in chair;with call bell/phone within reach;with bed alarm set;with family/visitor present   GP     Erica Dunn, Erica Dunn 03/20/2013, 1:48 PM

## 2013-03-20 NOTE — Progress Notes (Addendum)
Patient ID: Erica Dunn, female   DOB: 08/16/1920, 80 y.o.   MRN: [id number] Feels much better. Eating breakfast. She denies any abdominal pain.  No nausea or vomiting. Filed Vitals:   03/19/13 1400 03/19/13 1517 03/19/13 2044 03/20/13 0645  BP: 108/46 120/68 136/70 108/46  Pulse: 65 71 75 75  Temp:  97.9 F (36.6 C) 98.3 F (36.8 C) 98.9 F (37.2 C)  TempSrc:  Oral Oral Oral  Resp: 19  20 20   Height:      Weight:      SpO2: 97% 98% 95% 98%   CMP     Component Value Date/Time   NA 140 03/20/2013 0455   K 5.0 03/20/2013 0455   CL 104 03/20/2013 0455   CO2 27 03/20/2013 0455   GLUCOSE 92 03/20/2013 0455   BUN 17 03/20/2013 0455   CREATININE 1.47* 03/20/2013 0455   CALCIUM 8.9 03/20/2013 0455   PROT 5.6* 03/20/2013 0455   ALBUMIN 1.9* 03/20/2013 0455   AST 72* 03/20/2013 0455   ALT 42* 03/20/2013 0455   ALKPHOS 359* 03/20/2013 0455   BILITOT 3.1* 03/20/2013 0455   GFRNONAA 30* 03/20/2013 0455   GFRAA 35* 03/20/2013 0455  alert. Lungs clear. Abdomen is soft. BS+. No abdominal pain.  Assessment;  #1. Acute cholangitis. Patient is status post ERCP with biliary stenting on 03/16/2013 because large calcified stones could not be removed. Bilirubin down 3.1 today.  Recommendations: Levaquin 250mg  Daily po. Repeat ERCP in 4-6 weeks.     GI attending note; Patient examined and lab data reviewed. She has no complaints whatsoever. Bilirubin continues to drop. Serum albumin is coming up now that she is eating. WBCs up to 14.1; would be concerned about C. Difficile however she is not having diarrhea; H&H is coming up. CBC will be repeated in a.m. If she does well she should be able to go home over the weekend and will be seen in the office in 2 weeks.

## 2013-03-21 DIAGNOSIS — R17 Unspecified jaundice: Secondary | ICD-10-CM

## 2013-03-21 DIAGNOSIS — K8309 Other cholangitis: Secondary | ICD-10-CM

## 2013-03-21 DIAGNOSIS — D72829 Elevated white blood cell count, unspecified: Secondary | ICD-10-CM

## 2013-03-21 LAB — CULTURE, BLOOD (ROUTINE X 2)
CULTURE: NO GROWTH
Culture: NO GROWTH

## 2013-03-21 LAB — CBC
HCT: 31.2 % — ABNORMAL LOW (ref 36.0–46.0)
HEMOGLOBIN: 10.1 g/dL — AB (ref 12.0–15.0)
MCH: 35.7 pg — AB (ref 26.0–34.0)
MCHC: 32.4 g/dL (ref 30.0–36.0)
MCV: 110.2 fL — AB (ref 78.0–100.0)
Platelets: 260 10*3/uL (ref 150–400)
RBC: 2.83 MIL/uL — AB (ref 3.87–5.11)
RDW: 16.7 % — ABNORMAL HIGH (ref 11.5–15.5)
WBC: 16 10*3/uL — ABNORMAL HIGH (ref 4.0–10.5)

## 2013-03-21 LAB — PROCALCITONIN: Procalcitonin: 0.61 ng/mL

## 2013-03-21 MED ORDER — PANTOPRAZOLE SODIUM 40 MG PO TBEC
40.0000 mg | DELAYED_RELEASE_TABLET | Freq: Every day | ORAL | Status: DC
  Administered 2013-03-21: 40 mg via ORAL
  Filled 2013-03-21: qty 1

## 2013-03-21 MED ORDER — METRONIDAZOLE 500 MG PO TABS
500.0000 mg | ORAL_TABLET | Freq: Three times a day (TID) | ORAL | Status: DC
  Administered 2013-03-21 – 2013-03-22 (×2): 500 mg via ORAL
  Filled 2013-03-21 (×2): qty 1

## 2013-03-21 MED ORDER — RISAQUAD PO CAPS
1.0000 | ORAL_CAPSULE | Freq: Every day | ORAL | Status: DC
  Administered 2013-03-21 – 2013-03-22 (×2): 1 via ORAL
  Filled 2013-03-21 (×2): qty 1

## 2013-03-21 NOTE — Progress Notes (Addendum)
Subjective: Pt admitted with cholangitis-S/P ERCP/SPHINCTEROTOMY/STENT PLACEMENT. T BILI 5.9 AND NOW DOWN TO 3.1. C/O WATERY STOOLS AND CHANGED TO LEVAQUIN YESTERDAY. PREVIOUSLY ON ZOSYN. PT HAS HAD ONE WATERY STOOL TODAY. PT DENIES FEVER, CHILLS, BRBPR, nausea, vomiting, melena abd pain, OR heartburn or indigestion.  Objective: Vital signs in last 24 hours: Temp:  [98.8 F (37.1 C)-99.1 F (37.3 C)] 99.1 F (37.3 C) (01/24 1343) Pulse Rate:  [74-78] 74 (01/24 1343) Resp:  [20] 20 (01/24 1343) BP: (100-125)/(72-73) 100/73 mmHg (01/24 1343) SpO2:  [94 %-99 %] 99 % (01/24 1343) Last BM Date: 03/20/13  Intake/Output from previous day: 01/23 0701 - 01/24 0700 In: 240 [P.O.:240] Out: -  Intake/Output this shift: Total I/O In: 360 [P.O.:360] Out: 450 [Urine:450]  General appearance: alert, cooperative and no distress Resp: clear to auscultation bilaterally Cardio: regular rate and rhythm GI: soft, non-tender; bowel sounds normal;   Lab Results:  Recent Labs  03/20/13 0455 03/21/13 1129  WBC 14.1* 16.0*  HGB 9.8* 10.1*  HCT 30.1* 31.2*  PLT 220 260   BMET  Recent Labs  03/19/13 0437 03/20/13 0455  NA 141 140  K 4.6 5.0  CL 106 104  CO2 26 27  GLUCOSE 80 92  BUN 22 17  CREATININE 1.58* 1.47*  CALCIUM 8.5 8.9   LFT  Recent Labs  03/20/13 0455  PROT 5.6*  ALBUMIN 1.9*  AST 72*  ALT 42*  ALKPHOS 359*  BILITOT 3.1*   Medications: I have reviewed the patient's current medications.  Assessment/Plan: ADMITTED WITH CHOLANGITIS CLINICALLY IMPROVED BUT CONTINUE WITH ELEVATED WBCS AND T BILI. DIFFERENTIAL DIAGNOSIS INCLUDES ABX ASSOCIATED DIARRHEA, UTI, C DIFF COLITIS, OR SUBACUTE CHOLANGITIS.  PLAN: 1. ADD FLAGYL TODAY 2. AGREE WITH C DIFF PCR. 3. ADD PROBIOTIC 4. CHECK HFP ON JAN 25. IF BILIRUBIN REMAINS ELEVATED CONSIDER ERCP/STENT EXCHANGE OR CHOLECYSTOSTOMY TUBE PLACEMENT BY IR JAN 26. 5. ADD PPI DAILY FOR GI PROPHYLAXIS WHILE PT TAKING ASA AND UNDER  STRESS.     LOS: 6 days   Sandi Fields 03/21/2013, 2:09 PM

## 2013-03-21 NOTE — Progress Notes (Signed)
[facility] PROGRESS NOTE  Erica Dunn RRN:[id number] DOB: 08-May-1920 DOA: 03/15/2013 PCP: Renee Rival, MD  Assessment/Plan: 80 year old female with history of , HTN, hypothyroidism, GERD came to the hospital with worsening abdominal pain for past one week found to have cholelithiasis, cholangitis, sepsis s/p ERCP developed respiratory failure resolved on BiPAP   1. Sepsis/hypotension/cholangitis; CT: distended gallbladder, hepatic duct, cholelithiasis  -s/p ERCP 1/19: stent laserbiliary decompression -cont antiemetics, appreciate, GI, surgery eval; diet per GI  - repeat US abdomen  showed distended GB with multiple gall stone and GB wall thickening.  - IV antibiotics transitioned to po levaquin.  - her leukocytosis is worsening, having frequent BM'S, low grade temp. Will monitor her one more day. She might require another ERCP . Repeat CMP in am.     2. Leukocytosis likely due to above; ; blood c/s: NGTD.  Rpt in am.   3. Acute respiratory failure multifactorial due to opioid/benzo +CHF; code blue called on 1/19; patient in respiratory distress, but hemodynamically stable;  -resolved on BiPAP/naloxone/flumazenil+lasix IV; cont prn bronchodilators, oxygen, . Echo cardiogram revealed diastolic dysfunction. With elevated troponin yesterday and abnormal EKG , consulted cardiology for further recommendations. Her breathing has improved and she is not in distress any more. She also denies any chest pain or sob.   4. Acute diastolic CHF; unclear history; CRX: pulmonary edema, S3;  -improving on IV lasix;  Change to po lasix, cont diuresis prn as BP allows;  -mild positive trop possible stress related vs CKD vs demand ischemia; f/u troponin's are negative.  - renal parameters better.   5. Abnormal LFTs, alk phos due to #1;transaminases are improving, but alk phos is increasing . [place] ABD showed distended GB with multiple gall stone and GB wall thickening.   6. HTN currently  hypotensive, hold ACE; prn hydralazine if hypertensive.   7. Hypothyroidism, cont levothyroxine; tsh within normal limits.  8. Acute anemia r/o blood loss; check occult blood; Tf prn; cbc in am.   9. Stage 3 CKD: improving.    Code Status: DNR Family Communication: d/w patient, family not at bedside.  Disposition Plan: transfer ed pt to telemetry. PT evaluation recommended HHPT.   Consultants:  GI  cardiology  Procedures:  Pend ? ERCP  Antibiotics:  Zosyn 1/19<<<1/22   (indicate start date, and stop date if known)  levaquin started on 1/22  HPI/Subjective: Alert, denies chest pain or sob. Had 2 BM today  Objective: Filed Vitals:   03/21/13 1343  BP: 100/73  Pulse: 74  Temp: 99.1 F (37.3 C)  Resp: 20    Intake/Output Summary (Last 24 hours) at 03/21/13 1455 Last data filed at 03/21/13 1200  Gross per 24 hour  Intake    360 ml  Output    450 ml  Net    -90 ml   Filed Weights   03/17/13 0500 03/18/13 0500 03/19/13 0500  Weight: 48.9 kg (107 lb 12.9 oz) 50.8 kg (111 lb 15.9 oz) 51.1 kg (112 lb 10.5 oz)    Exam:   General:  Alert, afebrile comfortable. Yellowish tinge to the skin.  Cardiovascular: s1,s2 rrr  Respiratory: ctab. Scattered rales at bases.   Abdomen: soft, nt, nd   Musculoskeletal: no LE edema   Data Reviewed: Basic Metabolic Panel:  Recent Labs Lab 03/16/13 0916 03/17/13 0457 03/18/13 0449 03/19/13 0437 03/20/13 0455  NA 142 144 140 141 140  K 3.9 3.8 3.5* 4.6 5.0  CL 101 106 105 106 104  CO2 26  '26 27 26 27  ' GLUCOSE 134* 79 80 80 92  BUN 21 25* 26* 22 17  CREATININE 1.55* 1.65* 1.80* 1.58* 1.47*  CALCIUM 8.5 7.9* 8.4 8.5 8.9   Liver Function Tests:  Recent Labs Lab 03/16/13 0916 03/17/13 0457 03/18/13 0449 03/19/13 0437 03/20/13 0455  AST 157* 223* 155* 105* 72*  ALT 68* 72* 67* 54* 42*  ALKPHOS 438* 289* 342* 393* 359*  BILITOT 10.7* 5.9* 3.5* 3.2* 3.1*  PROT 6.8 5.1* 5.2* 5.4* 5.6*  ALBUMIN 2.4* 1.8* 1.7*  1.8* 1.9*    Recent Labs Lab 03/15/13 2230 03/17/13 0437  LIPASE 27  --   AMYLASE  --  28   No results found for this basename: AMMONIA,  in the last 168 hours CBC:  Recent Labs Lab 03/15/13 2230 03/16/13 0916 03/17/13 0437 03/18/13 0449 03/20/13 0455 03/21/13 1129  WBC 18.2* 20.5* 11.8* 6.9 14.1* 16.0*  NEUTROABS 16.5*  --   --   --   --   --   HGB 12.5 10.8* 8.7* 8.8* 9.8* 10.1*  HCT 36.4 31.9* 26.3* 26.3* 30.1* 31.2*  MCV 104.6* 105.3* 105.6* 106.0* 108.7* 110.2*  PLT 340 261 182 190 220 260   Cardiac Enzymes:  Recent Labs Lab 03/17/13 0914 03/17/13 2240  TROPONINI 0.38* <0.30   BNP (last 3 results)  Recent Labs  03/16/13 0900  PROBNP 7350.0*   CBG:  Recent Labs Lab 03/17/13 1127  GLUCAP 109*    Recent Results (from the past 240 hour(s))  CULTURE, BLOOD (ROUTINE X 2)     Status: None   Collection Time    03/16/13  9:16 AM      Result Value Range Status   Specimen Description BLOOD LEFT FOREARM   Final   Special Requests BOTTLES DRAWN AEROBIC AND ANAEROBIC 5CC   Final   Culture NO GROWTH 5 DAYS   Final   Report Status 03/21/2013 FINAL   Final  CULTURE, BLOOD (ROUTINE X 2)     Status: None   Collection Time    03/16/13  9:16 AM      Result Value Range Status   Specimen Description BLOOD LEFT ANTECUBITAL   Final   Special Requests BOTTLES DRAWN AEROBIC AND ANAEROBIC 6CC   Final   Culture NO GROWTH 5 DAYS   Final   Report Status 03/21/2013 FINAL   Final  SURGICAL PCR SCREEN     Status: None   Collection Time    03/16/13  9:27 AM      Result Value Range Status   MRSA, PCR NEGATIVE  NEGATIVE Final   Staphylococcus aureus NEGATIVE  NEGATIVE Final   Comment:            The Xpert SA Assay (FDA     approved for NASAL specimens     in patients over 15 years of age),     is one component of     a comprehensive surveillance     program.  Test performance has     been validated by [employer] for patients greater     than or equal to 34 year  old.     It is not intended     to diagnose infection nor to     guide or monitor treatment.     Studies: No results found.  Scheduled Meds: . acyclovir  800 mg Oral BID  . aspirin EC  81 mg Oral Daily  . beta carotene w/minerals  1 tablet Oral BID  . feeding supplement (ENSURE COMPLETE)  237 mL Oral BID BM  . furosemide  20 mg Oral Daily  . levofloxacin  250 mg Oral Daily  . levothyroxine  50 mcg Oral QAC breakfast  . potassium chloride  40 mEq Oral BID  . prednisoLONE acetate  1 drop Left Eye Daily  . timolol  1 drop Right Eye Daily   Continuous Infusions:    Active Problems:   Choledocholithiasis with obstruction   Choledocholithiasis   HTN (hypertension)   Acute diastolic heart failure    Time spent: >35 minutes     Anayla Giannetti  [facility] Pager [phone]. If 7PM-7AM, please contact night-coverage at [email or web address], password [facility] 03/21/2013, 2:55 PM  LOS: 6 days

## 2013-03-22 DIAGNOSIS — I10 Essential (primary) hypertension: Secondary | ICD-10-CM

## 2013-03-22 DIAGNOSIS — K8051 Calculus of bile duct without cholangitis or cholecystitis with obstruction: Secondary | ICD-10-CM

## 2013-03-22 LAB — COMPREHENSIVE METABOLIC PANEL
ALT: 28 U/L (ref 0–35)
AST: 42 U/L — ABNORMAL HIGH (ref 0–37)
Albumin: 2.1 g/dL — ABNORMAL LOW (ref 3.5–5.2)
Alkaline Phosphatase: 323 U/L — ABNORMAL HIGH (ref 39–117)
BILIRUBIN TOTAL: 2.2 mg/dL — AB (ref 0.3–1.2)
BUN: 15 mg/dL (ref 6–23)
CALCIUM: 9.1 mg/dL (ref 8.4–10.5)
CHLORIDE: 100 meq/L (ref 96–112)
CO2: 30 mEq/L (ref 19–32)
Creatinine, Ser: 1.68 mg/dL — ABNORMAL HIGH (ref 0.50–1.10)
GFR calc non Af Amer: 26 mL/min — ABNORMAL LOW (ref >90)
GFR, EST AFRICAN AMERICAN: 30 mL/min — AB (ref >90)
GLUCOSE: 86 mg/dL (ref 70–99)
Potassium: 5.1 mEq/L (ref 3.7–5.3)
Sodium: 138 mEq/L (ref 137–147)
Total Protein: 6.2 g/dL (ref 6.0–8.3)

## 2013-03-22 LAB — CLOSTRIDIUM DIFFICILE BY PCR: Toxigenic C. Difficile by PCR: NEGATIVE

## 2013-03-22 LAB — CBC
HCT: 31.2 % — ABNORMAL LOW (ref 36.0–46.0)
Hemoglobin: 10.1 g/dL — ABNORMAL LOW (ref 12.0–15.0)
MCH: 35.4 pg — ABNORMAL HIGH (ref 26.0–34.0)
MCHC: 32.4 g/dL (ref 30.0–36.0)
MCV: 109.5 fL — ABNORMAL HIGH (ref 78.0–100.0)
Platelets: 294 10*3/uL (ref 150–400)
RBC: 2.85 MIL/uL — ABNORMAL LOW (ref 3.87–5.11)
RDW: 16.7 % — ABNORMAL HIGH (ref 11.5–15.5)
WBC: 11.1 10*3/uL — ABNORMAL HIGH (ref 4.0–10.5)

## 2013-03-22 LAB — BILIRUBIN, DIRECT: Bilirubin, Direct: 1.4 mg/dL — ABNORMAL HIGH (ref 0.0–0.3)

## 2013-03-22 MED ORDER — METRONIDAZOLE 500 MG PO TABS: 500.0000 mg | ORAL_TABLET | Freq: Three times a day (TID) | ORAL | Status: DC

## 2013-03-22 MED ORDER — ENSURE COMPLETE PO LIQD: 237.0000 mL | Freq: Two times a day (BID) | ORAL | Status: DC

## 2013-03-22 MED ORDER — RISAQUAD PO CAPS: 1.0000 | ORAL_CAPSULE | Freq: Every day | ORAL | Status: DC

## 2013-03-22 MED ORDER — ASPIRIN 81 MG PO TBEC: 81.0000 mg | DELAYED_RELEASE_TABLET | Freq: Every day | ORAL | Status: DC

## 2013-03-22 MED ORDER — LEVOFLOXACIN 250 MG PO TABS: 250.0000 mg | ORAL_TABLET | Freq: Every day | ORAL | Status: DC

## 2013-03-22 NOTE — Progress Notes (Signed)
Pt a/o.vss. Saline lock removed. No complaints of any distress. Discharge instructions given. Prescriptions given. Pt family verbalized understanding of instructions. Pt left floor via wheelchair with family and nursing staff.

## 2013-03-22 NOTE — Discharge Summary (Signed)
Physician Discharge Summary  Erica Dunn WIO:[id number] DOB: [birth year] DOA: 03/15/2013  PCP: Erica Dunn  Admit date: 03/15/2013 Discharge date: 03/22/2013  Time spent: 35 minutes  Recommendations for Outpatient Follow-up:  1. Follow up with GI , and PCP in 1to 2 weeks.   Discharge Diagnoses:  Active Problems:   Choledocholithiasis with obstruction   Choledocholithiasis   HTN (hypertension)   Acute diastolic heart failure   Discharge Condition: improved.   Diet recommendation: low sodium diet  Filed Weights   03/17/13 0500 03/18/13 0500 03/19/13 0500  Weight: 48.9 kg (107 lb 12.9 oz) 50.8 kg (111 lb 15.9 oz) 51.1 kg (112 lb 10.5 oz)    History of present illnesS/ Hospital Course:  80 year old female with history of , HTN, hypothyroidism, GERD came to the hospital with worsening abdominal pain for past one week found to have cholelithiasis, cholangitis, sepsis s/p ERCP developed respiratory failure resolved on BiPAP  1. Sepsis/hypotension/cholangitis; CT: distended gallbladder, hepatic duct, cholelithiasis  -s/p ERCP 1/19: stent laserbiliary decompression   appreciate, GI, surgery eval; diet per GI  - repeat US abdomen showed distended GB with multiple gall stone and GB wall thickening.  - IV antibiotics transitioned to po levaquin and flagyl. She might require another ERCP in 2 weeks as per GI.  2. Leukocytosis likely due to above; ; blood c/s: NGTD.   3. Acute respiratory failure multifactorial due to opioid/benzo +CHF; code blue called on 1/19; patient in respiratory distress, but hemodynamically stable;  -resolved on BiPAP/naloxone/flumazenil+lasix IV; cont prn bronchodilators, oxygen, . Echo cardiogram revealed diastolic dysfunction. With elevated troponin yesterday and abnormal EKG , consulted cardiology for further recommendations. Her breathing has improved and she is not in distress any more. She also denies any chest pain or sob.  4. Acute diastolic CHF; unclear  history; CRX: pulmonary edema, S3;  -improving on IV lasix; Change to po lasix, . -mild positive trop possible stress related vs CKD vs demand ischemia; f/Dunn troponin's are negative.  - renal parameters better.  5. Abnormal LFTs, alk phos due to #1;transaminases are improving,. [place] ABD showed distended GB with multiple gall stone and GB wall thickening. Outpatient follow up with gastroenterologist as recommended.  6. HTN currently well controlled.  7. Hypothyroidism, cont levothyroxine; tsh within normal limits.  8. Acute anemia: stabilized, recommend outpatient follow up.  9. Stage 3 CKD: improved    Procedures: ERCP  Consultations:GI  Discharge Exam: Filed Vitals:   03/22/13 0531  BP: 148/54  Pulse: 78  Temp: 98.6 F (37 C)  Resp: 20   General: Alert, afebrile comfortable. Yellowish tinge to the skin. Cardiovascular: s1,s2 rrr Respiratory: ctab. Scattered rales at bases. Abdomen: soft, nt, nd Musculoskeletal: no LE edema    Discharge Instructions  Discharge Orders   Future Orders Complete By Expires   Discharge instructions  As directed    Comments:     Follow up with Erica Dunn in one week.       Medication List    STOP taking these medications       enalapril 20 MG tablet  Commonly known as:  VASOTEC      TAKE these medications       acidophilus Caps capsule  Take 1 capsule by mouth daily.     acyclovir 800 MG tablet  Commonly known as:  ZOVIRAX  Take 800 mg by mouth 2 (two) times daily.     aspirin 81 MG EC tablet  Take 1 tablet (81 mg total) by  mouth daily.     beta carotene w/minerals tablet  Take 1 tablet by mouth 2 (two) times daily.     CALCIUM 600+D 600-400 MG-UNIT per tablet  Generic drug:  Calcium Carbonate-Vitamin D  Take 1 tablet by mouth 2 (two) times daily.     cyanocobalamin 1000 MCG/ML injection  Commonly known as:  (VITAMIN B-12)  Inject 1,000 mcg into the muscle every 30 (thirty) days.     DUREZOL 0.05 % Emul  Generic drug:   Difluprednate  Place 1 drop into the left eye daily.     feeding supplement (ENSURE COMPLETE) Liqd  Take 237 mLs by mouth 2 (two) times daily between meals.     furosemide 20 MG tablet  Commonly known as:  LASIX  Take 20 mg by mouth daily.     levofloxacin 250 MG tablet  Commonly known as:  LEVAQUIN  Take 1 tablet (250 mg total) by mouth daily.     levothyroxine 50 MCG tablet  Commonly known as:  SYNTHROID, LEVOTHROID  Take 50 mcg by mouth daily before breakfast.     metroNIDAZOLE 500 MG tablet  Commonly known as:  FLAGYL  Take 1 tablet (500 mg total) by mouth 3 (three) times daily between meals.     omeprazole 20 MG capsule  Commonly known as:  PRILOSEC  Take 20 mg by mouth 2 (two) times daily.     timolol 0.5 % ophthalmic solution  Commonly known as:  TIMOPTIC  Place 1 drop into the right eye daily.     Vitamin D (Ergocalciferol) 50000 UNITS Caps capsule  Commonly known as:  DRISDOL  Take 50,000 Units by mouth every 30 (thirty) days.       No Known Allergies     Follow-up Information   Follow up with Erica Dunn.   Specialty:  Home Health Services   Contact information:   Erica Dunn [id number] [phone]       Follow up with Erica Dunn. Schedule an appointment as soon as possible for a visit in 1 week.   Specialty:  Nurse Practitioner   Contact information:   [address] [phone]       Follow up with Erica Dunn. Schedule an appointment as soon as possible for a visit in 1 week.   Specialty:  Gastroenterology   Contact information:   [address], [address] [place] [address] [phone]        The results of significant diagnostics from this hospitalization (including imaging, microbiology, ancillary and laboratory) are listed below for reference.    Significant Diagnostic Studies: Ct Abdomen Pelvis Wo Contrast  03/16/2013   CLINICAL DATA:  Nausea and upper  abdominal pain.  EXAM: CT ABDOMEN AND PELVIS WITHOUT CONTRAST  TECHNIQUE: Multidetector CT imaging of the abdomen and pelvis was performed following the standard protocol without intravenous contrast.  COMPARISON:  None.  FINDINGS: A trace right pleural effusion is noted, with associated atelectasis. Mild apparent wall thickening at the distal esophagus may reflect sequelae of gastroesophageal reflux.  Scattered calcified granulomata are seen within the liver and spleen. There is prominence of the intrahepatic biliary ducts. The common hepatic duct is dilated to 1.5 cm in diameter, with two obstructing stones seen distally at the level of the pancreatic head, measuring 1.1 cm and 1.0 cm. The gallbladder is markedly distended, with a 2.9 cm focus of mildly increased attenuation possibly reflecting a sludge ball or  stone.  The spleen is otherwise unremarkable in appearance. The pancreas and adrenal glands are within normal limits.  The kidneys are unremarkable in appearance. There is no evidence of hydronephrosis. No renal or ureteral stones are seen. No perinephric stranding is appreciated.  No free fluid is identified. The small bowel is unremarkable in appearance. The stomach is within normal limits. No acute vascular abnormalities are seen. Diffuse calcification is noted along the abdominal aorta and its branches.  The appendix is normal in caliber, without evidence for appendicitis. Stool is seen partially filling the colon. Scattered diverticulosis is noted along the sigmoid colon, without evidence of diverticulitis.  The bladder is mildly distended and grossly unremarkable in appearance. The patient is status post hysterectomy. The ovaries are relatively symmetric; no suspicious adnexal masses are seen. No inguinal lymphadenopathy is seen.  No acute osseous abnormalities are identified. Vacuum phenomenon is noted at L3-L4.  IMPRESSION: 1. Markedly distended gallbladder, with a 2.9 cm focus of mildly increased  attenuation possibly reflecting a large stone or sludge ball. Dilatation of the common hepatic duct to 1.5 cm in diameter, with two obstructing stones seen distally at the level of the pancreatic head, measuring 1.1 cm and 1.0 cm. Associated prominence of the intrahepatic biliary ducts. ERCP would be helpful for further evaluation and treatment. 2. Trace right pleural effusion, with associated atelectasis. 3. Mild apparent wall thickening at the distal esophagus may reflect sequelae of gastroesophageal reflux. 4. Diffuse calcification along the abdominal aorta and its branches. 5. Scattered diverticulosis along the sigmoid colon, without evidence of diverticulitis.  These results were called by telephone at the time of interpretation on 03/16/2013 at 1:57 AM to Erica. Virgel Manifold, who verbally acknowledged these results.   Electronically Signed   By: Garald Balding M.D.   On: 03/16/2013 01:57   Dg Chest Port 1 View  03/19/2013   CLINICAL DATA:  Assess the pulmonary interstitium for residual interstitial edema  EXAM: PORTABLE CHEST - 1 VIEW  COMPARISON:  Portable chest x-ray of 16 March 2013  FINDINGS: The lungs are adequately inflated. The interstitial markings are mildly increased but have improved since the previous study. The left hemidiaphragm is now faintly visible as is a portion of the right hemidiaphragm. There remains blunting of the right lateral costophrenic gutter. The cardiac silhouette is top-normal in size. The pulmonary vascularity is less prominent today. There is tortuosity of the descending thoracic aorta. The observed portions of the bony thorax exhibit no acute abnormalities.  IMPRESSION: 1. There has been interval improvement in the appearance of the pulmonary interstitium consistent with resolving edema. 2. The bilateral pleural effusions have decreased in size. Only a small amount of fluid is now visible on the right. 3. The cardiac silhouette remains mildly enlarged in the central  pulmonary vascularity remains prominent but these findings are less conspicuous than on the previous study.   Electronically Signed   By: David  Martinique   On: 03/19/2013 13:38   Dg Chest Port 1 View  03/16/2013   CLINICAL DATA:  Acute respiratory arrest.  EXAM: PORTABLE CHEST - 1 VIEW  COMPARISON:  None.  FINDINGS: Heart size is prominent. Diffuse interstitial infiltrates are seen suspicious for interstitial edema. Probable small layering right pleural effusion also noted. No evidence of pulmonary consolidation.  IMPRESSION: Suspect diffuse interstitial edema and small right pleural effusion.   Electronically Signed   By: Earle Gell M.D.   On: 03/16/2013 14:41   Dg Ercp Biliary & Pancreatic Ducts  03/16/2013   CLINICAL DATA:  Choledocholithiasis  EXAM: ERCP  TECHNIQUE: Multiple spot images obtained with the fluoroscopic device and submitted for interpretation post-procedure.  COMPARISON:  CT scan 03/16/2013  FINDINGS: Multiple fluoroscopic spot images from an ERCP demonstrate a dilated common bile duct with multiple large stones. Subsequent sphincterotomy and balloon passage. The stones remain in the distal common bile duct.  Mild narrowing near the confluence of the hepatic ducts is likely due to compression by a dilated gallbladder which correlates with the CT finding.  IMPRESSION: Choledocholithiasiswith sphincterotomy, balloon passage and plastic stent placement. Common bowel duct stones remain.  Mild narrowing of the common hepatic duct likely due to a dilated gallbladder.  These images were submitted for radiologic interpretation only. Please see the procedural report for the amount of contrast and the fluoroscopy time utilized.   Electronically Signed   By: Kalman Jewels M.D.   On: 03/16/2013 12:21   US Abdomen Limited Ruq  03/18/2013   CLINICAL DATA:  Gallstones.  EXAM: US ABDOMEN LIMITED - RIGHT UPPER QUADRANT  COMPARISON:  CT 03/16/2013.  FINDINGS: Gallbladder:  Multiple mobile gallstones.  Gallbladder wall thickness 5.7 mm. Gallbladder is distended.  Common bile duct:  Diameter: 10.8 mm. A biliary stent is noted. Probable common bile duct stones present in the distal common bile duct. , image 46. This measures 9 mm. Intrahepatic biliary ductal distention also present.  Liver:  Multiple calcifications noted throughout the liver and spleen consistent with granulomas.  Right pleural effusion is noted.  IMPRESSION: 1. Distended gallbladder with multiple gallstones and gallbladder wall thickening. Cholecystitis cannot be excluded. 2. Intrahepatic and extrahepatic biliary distention. A biliary stent is noted. 9 mm distal common bile duct stone noted. 3. Right pleural effusion.   Electronically Signed   By: Marcello Moores  Register   On: 03/18/2013 15:41    Microbiology: Recent Results (from the past 240 hour(s))  CULTURE, BLOOD (ROUTINE X 2)     Status: None   Collection Time    03/16/13  9:16 AM      Result Value Range Status   Specimen Description BLOOD LEFT FOREARM   Final   Special Requests BOTTLES DRAWN AEROBIC AND ANAEROBIC 5CC   Final   Culture NO GROWTH 5 DAYS   Final   Report Status 03/21/2013 FINAL   Final  CULTURE, BLOOD (ROUTINE X 2)     Status: None   Collection Time    03/16/13  9:16 AM      Result Value Range Status   Specimen Description BLOOD LEFT ANTECUBITAL   Final   Special Requests BOTTLES DRAWN AEROBIC AND ANAEROBIC 6CC   Final   Culture NO GROWTH 5 DAYS   Final   Report Status 03/21/2013 FINAL   Final  SURGICAL PCR SCREEN     Status: None   Collection Time    03/16/13  9:27 AM      Result Value Range Status   MRSA, PCR NEGATIVE  NEGATIVE Final   Staphylococcus aureus NEGATIVE  NEGATIVE Final   Comment:            The Xpert SA Assay (FDA     approved for NASAL specimens     in patients over 62 years of age),     is one component of     a comprehensive surveillance     program.  Test performance has     been validated by [employer] for patients greater  than or equal to 37 year old.     It is not intended     to diagnose infection nor to     guide or monitor treatment.  CLOSTRIDIUM DIFFICILE BY PCR     Status: None   Collection Time    03/22/13 10:05 AM      Result Value Range Status   C difficile by pcr NEGATIVE  NEGATIVE Final     Labs: Basic Metabolic Panel:  Recent Labs Lab 03/17/13 0457 03/18/13 0449 03/19/13 0437 03/20/13 0455 03/22/13 0548  NA 144 140 141 140 138  K 3.8 3.5* 4.6 5.0 5.1  CL 106 105 106 104 100  CO2 '26 27 26 27 30  ' GLUCOSE 79 80 80 92 86  BUN 25* 26* '22 17 15  ' CREATININE 1.65* 1.80* 1.58* 1.47* 1.68*  CALCIUM 7.9* 8.4 8.5 8.9 9.1   Liver Function Tests:  Recent Labs Lab 03/17/13 0457 03/18/13 0449 03/19/13 0437 03/20/13 0455 03/22/13 0548  AST 223* 155* 105* 72* 42*  ALT 72* 67* 54* 42* 28  ALKPHOS 289* 342* 393* 359* 323*  BILITOT 5.9* 3.5* 3.2* 3.1* 2.2*  PROT 5.1* 5.2* 5.4* 5.6* 6.2  ALBUMIN 1.8* 1.7* 1.8* 1.9* 2.1*    Recent Labs Lab 03/15/13 2230 03/17/13 0437  LIPASE 27  --   AMYLASE  --  28   No results found for this basename: AMMONIA,  in the last 168 hours CBC:  Recent Labs Lab 03/15/13 2230  03/17/13 0437 03/18/13 0449 03/20/13 0455 03/21/13 1129 03/22/13 0548  WBC 18.2*  < > 11.8* 6.9 14.1* 16.0* 11.1*  NEUTROABS 16.5*  --   --   --   --   --   --   HGB 12.5  < > 8.7* 8.8* 9.8* 10.1* 10.1*  HCT 36.4  < > 26.3* 26.3* 30.1* 31.2* 31.2*  MCV 104.6*  < > 105.6* 106.0* 108.7* 110.2* 109.5*  PLT 340  < > 182 190 220 260 294  < > = values in this interval not displayed. Cardiac Enzymes:  Recent Labs Lab 03/17/13 0914 03/17/13 2240  TROPONINI 0.38* <0.30   BNP: BNP (last 3 results)  Recent Labs  03/16/13 0900  PROBNP 7350.0*   CBG:  Recent Labs Lab 03/17/13 1127  GLUCAP 109*       Signed:  Leshae Mcclay  [facility] 03/22/2013, 11:45 AM

## 2013-03-22 NOTE — Progress Notes (Signed)
Subjective: Since I last evaluated the patient SHE REPORTS 1-2 BMs. C DIFF PCR PENDING. PT DENIES FEVER, CHILLS, nausea, vomiting, melena, OR abd pain.  Objective: Vital signs in last 24 hours: Temp:  [98.6 F (37 C)-99.1 F (37.3 C)] 98.6 F (37 C) (01/25 0531) Pulse Rate:  [74-78] 78 (01/25 0531) Resp:  [20] 20 (01/25 0531) BP: (100-152)/(50-73) 148/54 mmHg (01/25 0531) SpO2:  [99 %-100 %] 100 % (01/25 0531) Last BM Date: 03/21/13  Intake/Output from previous day: 01/24 0701 - 01/25 0700 In: 600 [P.O.:600] Out: 950 [Urine:950] Intake/Output this shift: Total I/O In: -  Out: 300 [Urine:300]  General appearance: alert, cooperative and no distress Resp: clear to auscultation bilaterally Cardio: regular rate and rhythm GI: soft, non-tender; bowel sounds normal;   Lab Results:  Recent Labs  03/20/13 0455 03/21/13 1129 03/22/13 0548  WBC 14.1* 16.0* 11.1*  HGB 9.8* 10.1* 10.1*  HCT 30.1* 31.2* 31.2*  PLT 220 260 294   BMET  Recent Labs  03/20/13 0455 03/22/13 0548  NA 140 138  K 5.0 5.1  CL 104 100  CO2 27 30  GLUCOSE 92 86  BUN 17 15  CREATININE 1.47* 1.68*  CALCIUM 8.9 9.1   LFT  Recent Labs  03/22/13 0548  PROT 6.2  ALBUMIN 2.1*  AST 42*  ALT 28  ALKPHOS 323*  BILITOT 2.2*  BILIDIR 1.4*    Studies/Results: No results found.  Medications: I have reviewed the patient's current medications.  Assessment/Plan: PT ADMITTED WITH CHOLANGITIS-WBC/T BILI IMPROVED. CLINICALLY IMPROVED. ONLY 2 BMs IN PAST 24 HRS. WANTS TO GO HOME.  PLAN: 1. CONTINUE  LEVAQUIN/FLAGYL AS OP FOR 10-14 DAYS 2. OK TO D/C TO HOME.  3. OPV W/ DR. Karilyn CotaEHMAN IN 7-10 DAYS 4. AWAITING C DIFF PCR.   LOS: 7 days   Nelsie Domino 03/22/2013, 10:26 AM

## 2013-03-30 NOTE — Progress Notes (Signed)
UR chart review completed.  

## 2013-04-02 ENCOUNTER — Encounter (INDEPENDENT_AMBULATORY_CARE_PROVIDER_SITE_OTHER): Payer: Self-pay | Admitting: Internal Medicine

## 2013-04-02 ENCOUNTER — Ambulatory Visit (INDEPENDENT_AMBULATORY_CARE_PROVIDER_SITE_OTHER): Payer: Medicare Other | Admitting: Internal Medicine

## 2013-04-02 ENCOUNTER — Encounter (INDEPENDENT_AMBULATORY_CARE_PROVIDER_SITE_OTHER): Payer: Self-pay | Admitting: *Deleted

## 2013-04-02 ENCOUNTER — Other Ambulatory Visit (INDEPENDENT_AMBULATORY_CARE_PROVIDER_SITE_OTHER): Payer: Self-pay | Admitting: *Deleted

## 2013-04-02 VITALS — BP 90/54 | HR 56 | Temp 98.0°F | Ht 59.0 in | Wt 93.4 lb

## 2013-04-02 DIAGNOSIS — K8050 Calculus of bile duct without cholangitis or cholecystitis without obstruction: Secondary | ICD-10-CM

## 2013-04-02 DIAGNOSIS — K831 Obstruction of bile duct: Secondary | ICD-10-CM

## 2013-04-02 NOTE — Progress Notes (Signed)
Subjective:     Patient ID: Erica Dunn, female   DOB: 02/21/1921, 80 y.o.   MRN: [id number]  HPI Admitted to [facility] in January with acute cholangitis secondary to choledocholithiasis. Distended gallbladder with cholelithiasis.   She underwent an ERCP 03/15/2013 and stent placement.   Patient developed CHF following ERCP/anesthesia.   Echocardiogram 55-60% EF.  Family states she is not eating. She has lost about 18 pounds since 03/19/2013.  She says she doesn't have an appetite. There is no abdominal pain. BM daily . No melena or bright red rectal bleeding.   C-diff negative.   03/15/2013 ERCP: Procedure: ERCP with sphincterotomy and biliary stenting.  Indications: Patient is 80 year old Caucasian female who presents with abdominal pain and low-grade fever and jaundice. CT reveals dilated biliary system with 2 large stones as well as distended gallbladder with stones. Impression:  Dilated biliary system with two large calcified stones.  Stone fragments removed using mechanical lithotripter but not be cleared of all pieces despite balloon dilation of ampulla.  10 French 9 cm long plastic stent laser biliary decompression. ERCP 6-8 weeks.    CMP     Component Value Date/Time   NA 138 03/22/2013 0548   K 5.1 03/22/2013 0548   CL 100 03/22/2013 0548   CO2 30 03/22/2013 0548   GLUCOSE 86 03/22/2013 0548   BUN 15 03/22/2013 0548   CREATININE 1.68* 03/22/2013 0548   CALCIUM 9.1 03/22/2013 0548   PROT 6.2 03/22/2013 0548   ALBUMIN 2.1* 03/22/2013 0548   AST 42* 03/22/2013 0548   ALT 28 03/22/2013 0548   ALKPHOS 323* 03/22/2013 0548   BILITOT 2.2* 03/22/2013 0548   GFRNONAA 26* 03/22/2013 0548   GFRAA 30* 03/22/2013 0548    CBC    Component Value Date/Time   WBC 11.1* 03/22/2013 0548   RBC 2.85* 03/22/2013 0548   HGB 10.1* 03/22/2013 0548   HCT 31.2* 03/22/2013 0548   PLT 294 03/22/2013 0548   MCV 109.5* 03/22/2013 0548   MCH 35.4* 03/22/2013 0548   MCHC 32.4 03/22/2013 0548   RDW 16.7* 03/22/2013  0548   LYMPHSABS 0.8 03/15/2013 2230   MONOABS 0.9 03/15/2013 2230   EOSABS 0.0 03/15/2013 2230   BASOSABS 0.0 03/15/2013 2230       Review of Systems Past Medical History  Diagnosis Date  . GERD (gastroesophageal reflux disease)   . Hyperthyroidism   . Essential hypertension, benign   . Aortic atherosclerosis     Documented by CT imaging  . Gallstones    Past Surgical History  Procedure Laterality Date  . Breast surgery Left     60 years ago  . Abdominal hysterectomy    . Ercp  03/16/2013    Procedure: ENDOSCOPIC RETROGRADE CHOLANGIOPANCREATOGRAPHY (ERCP) (Duct not cleared of all stones);  Surgeon: Malissa HippoNajeeb U Rehman, MD;  Location: [facility];  Service: Endoscopy;;  . Sphincterotomy  03/16/2013    Procedure: Dennison MascotSPHINCTEROTOMY;  Surgeon: Malissa HippoNajeeb U Rehman, MD;  Location: [facility];  Service: Endoscopy;;  . Balloon dilation  03/16/2013    Procedure: Marvis RepressBALLOON DILATION;  Surgeon: Malissa HippoNajeeb U Rehman, MD;  Location: [facility];  Service: Endoscopy;;  . Removal of stones  03/16/2013    Procedure: REMOVAL OF STONES WITH STONE BASKET;  Surgeon: Malissa HippoNajeeb U Rehman, MD;  Location: [facility];  Service: Endoscopy;;  . Biliary stent placement  03/16/2013    Procedure: BILIARY STENT PLACEMENT;  Surgeon: Malissa HippoNajeeb U Rehman, MD;  Location: [facility];  Service: Endoscopy;;   No  Known Allergies     Objective:   Physical Exam  Filed Vitals:   04/02/13 1440  BP: 90/54  Pulse: 56  Temp: 98 F (36.7 C)  Height: 4\' 11"  (1.499 m)  Weight: 93 lb 6.4 oz (42.366 kg)  Alert and oriented. Skin warm and dry. Oral mucosa is moist.   . Sclera anicteric, conjunctivae is pink. Thyroid not enlarged. No cervical lymphadenopathy. Lungs clear. Heart regular rate and rhythm.  Abdomen is soft. Bowel sounds are positive. No hepatomegaly. No abdominal masses felt. No tenderness.  No edema to lower extremities.        Assessment:    Hx of CBC stones. Underwent a spincterotomy recently. Not all stones were removed.   Hx of anemia.  Plan:    Will  schedule an ERCP. CMET,cbc today.

## 2013-04-02 NOTE — Patient Instructions (Signed)
ERCP. Labs today.

## 2013-04-03 LAB — CBC WITH DIFFERENTIAL/PLATELET
Basophils Absolute: 0.1 10*3/uL (ref 0.0–0.1)
Basophils Relative: 1 % (ref 0–1)
EOS ABS: 0.5 10*3/uL (ref 0.0–0.7)
Eosinophils Relative: 4 % (ref 0–5)
HCT: 40.7 % (ref 36.0–46.0)
Hemoglobin: 13.6 g/dL (ref 12.0–15.0)
LYMPHS ABS: 3.1 10*3/uL (ref 0.7–4.0)
Lymphocytes Relative: 24 % (ref 12–46)
MCH: 35.8 pg — ABNORMAL HIGH (ref 26.0–34.0)
MCHC: 33.4 g/dL (ref 30.0–36.0)
MCV: 107.1 fL — ABNORMAL HIGH (ref 78.0–100.0)
Monocytes Absolute: 1.1 10*3/uL — ABNORMAL HIGH (ref 0.1–1.0)
Monocytes Relative: 8 % (ref 3–12)
NEUTROS PCT: 63 % (ref 43–77)
Neutro Abs: 8.1 10*3/uL — ABNORMAL HIGH (ref 1.7–7.7)
Platelets: 400 10*3/uL (ref 150–400)
RBC: 3.8 MIL/uL — AB (ref 3.87–5.11)
RDW: 16.6 % — ABNORMAL HIGH (ref 11.5–15.5)
WBC: 12.8 10*3/uL — ABNORMAL HIGH (ref 4.0–10.5)

## 2013-04-03 LAB — COMPREHENSIVE METABOLIC PANEL
ALK PHOS: 205 U/L — AB (ref 39–117)
ALT: 14 U/L (ref 0–35)
AST: 51 U/L — ABNORMAL HIGH (ref 0–37)
Albumin: 3.6 g/dL (ref 3.5–5.2)
BILIRUBIN TOTAL: 1.5 mg/dL — AB (ref 0.2–1.2)
BUN: 21 mg/dL (ref 6–23)
CO2: 29 mEq/L (ref 19–32)
Calcium: 9.5 mg/dL (ref 8.4–10.5)
Chloride: 100 mEq/L (ref 96–112)
Creat: 1.25 mg/dL — ABNORMAL HIGH (ref 0.50–1.10)
Glucose, Bld: 99 mg/dL (ref 70–99)
Potassium: 4.1 mEq/L (ref 3.5–5.3)
SODIUM: 140 meq/L (ref 135–145)
Total Protein: 6.9 g/dL (ref 6.0–8.3)

## 2013-04-12 ENCOUNTER — Encounter (HOSPITAL_COMMUNITY): Payer: Self-pay | Admitting: Emergency Medicine

## 2013-04-12 ENCOUNTER — Emergency Department (HOSPITAL_COMMUNITY)
Admission: EM | Admit: 2013-04-12 | Discharge: 2013-04-12 | Disposition: A | Discharge status: Home / Self Care | Payer: Medicare Other | Attending: Emergency Medicine | Admitting: Emergency Medicine

## 2013-04-12 DIAGNOSIS — Z7982 Long term (current) use of aspirin: Secondary | ICD-10-CM | POA: Insufficient documentation

## 2013-04-12 DIAGNOSIS — I10 Essential (primary) hypertension: Secondary | ICD-10-CM | POA: Insufficient documentation

## 2013-04-12 DIAGNOSIS — E0590 Thyrotoxicosis, unspecified without thyrotoxic crisis or storm: Secondary | ICD-10-CM | POA: Insufficient documentation

## 2013-04-12 DIAGNOSIS — Z79899 Other long term (current) drug therapy: Secondary | ICD-10-CM | POA: Insufficient documentation

## 2013-04-12 DIAGNOSIS — N390 Urinary tract infection, site not specified: Secondary | ICD-10-CM | POA: Insufficient documentation

## 2013-04-12 DIAGNOSIS — K219 Gastro-esophageal reflux disease without esophagitis: Secondary | ICD-10-CM | POA: Insufficient documentation

## 2013-04-12 DIAGNOSIS — R197 Diarrhea, unspecified: Secondary | ICD-10-CM | POA: Insufficient documentation

## 2013-04-12 LAB — COMPREHENSIVE METABOLIC PANEL
ALT: 12 U/L (ref 0–35)
AST: 34 U/L (ref 0–37)
Albumin: 3.1 g/dL — ABNORMAL LOW (ref 3.5–5.2)
Alkaline Phosphatase: 159 U/L — ABNORMAL HIGH (ref 39–117)
BUN: 22 mg/dL (ref 6–23)
CALCIUM: 9.8 mg/dL (ref 8.4–10.5)
CO2: 29 meq/L (ref 19–32)
CREATININE: 1.26 mg/dL — AB (ref 0.50–1.10)
Chloride: 100 mEq/L (ref 96–112)
GFR calc Af Amer: 42 mL/min — ABNORMAL LOW (ref >90)
GFR, EST NON AFRICAN AMERICAN: 36 mL/min — AB (ref >90)
Glucose, Bld: 144 mg/dL — ABNORMAL HIGH (ref 70–99)
Potassium: 4.8 mEq/L (ref 3.7–5.3)
SODIUM: 140 meq/L (ref 137–147)
TOTAL PROTEIN: 7.9 g/dL (ref 6.0–8.3)
Total Bilirubin: 1.1 mg/dL (ref 0.3–1.2)

## 2013-04-12 LAB — CBC WITH DIFFERENTIAL/PLATELET
Basophils Absolute: 0 10*3/uL (ref 0.0–0.1)
Basophils Relative: 0 % (ref 0–1)
EOS ABS: 0.2 10*3/uL (ref 0.0–0.7)
EOS PCT: 2 % (ref 0–5)
HEMATOCRIT: 44 % (ref 36.0–46.0)
Hemoglobin: 14.9 g/dL (ref 12.0–15.0)
LYMPHS PCT: 6 % — AB (ref 12–46)
Lymphs Abs: 0.7 10*3/uL (ref 0.7–4.0)
MCH: 37.1 pg — AB (ref 26.0–34.0)
MCHC: 33.9 g/dL (ref 30.0–36.0)
MCV: 109.5 fL — AB (ref 78.0–100.0)
MONO ABS: 0.4 10*3/uL (ref 0.1–1.0)
Monocytes Relative: 4 % (ref 3–12)
Neutro Abs: 9.9 10*3/uL — ABNORMAL HIGH (ref 1.7–7.7)
Neutrophils Relative %: 88 % — ABNORMAL HIGH (ref 43–77)
PLATELETS: 267 10*3/uL (ref 150–400)
RBC: 4.02 MIL/uL (ref 3.87–5.11)
RDW: 15.3 % (ref 11.5–15.5)
WBC: 11.3 10*3/uL — ABNORMAL HIGH (ref 4.0–10.5)

## 2013-04-12 LAB — URINALYSIS, ROUTINE W REFLEX MICROSCOPIC
BILIRUBIN URINE: NEGATIVE
Glucose, UA: NEGATIVE mg/dL
KETONES UR: NEGATIVE mg/dL
Nitrite: NEGATIVE
PROTEIN: NEGATIVE mg/dL
SPECIFIC GRAVITY, URINE: 1.025 (ref 1.005–1.030)
Urobilinogen, UA: 0.2 mg/dL (ref 0.0–1.0)
pH: 5.5 (ref 5.0–8.0)

## 2013-04-12 LAB — URINE MICROSCOPIC-ADD ON

## 2013-04-12 LAB — LIPASE, BLOOD: Lipase: 58 U/L (ref 11–59)

## 2013-04-12 MED ORDER — DEXTROSE 5 % IV SOLN
1.0000 g | Freq: Once | INTRAVENOUS | Status: AC
  Administered 2013-04-12: 1 g via INTRAVENOUS
  Filled 2013-04-12: qty 10

## 2013-04-12 MED ORDER — ONDANSETRON HCL 4 MG/2ML IJ SOLN
4.0000 mg | Freq: Once | INTRAMUSCULAR | Status: AC
  Administered 2013-04-12: 4 mg via INTRAVENOUS
  Filled 2013-04-12: qty 2

## 2013-04-12 MED ORDER — CEFPROZIL 250 MG/5ML PO SUSR
250.0000 mg | Freq: Two times a day (BID) | ORAL | Status: DC
Start: 2013-04-12 — End: 2013-05-08

## 2013-04-12 MED ORDER — SODIUM CHLORIDE 0.9 % IV BOLUS (SEPSIS)
250.0000 mL | Freq: Once | INTRAVENOUS | Status: AC
  Administered 2013-04-12: 250 mL via INTRAVENOUS

## 2013-04-12 MED ORDER — FLUCONAZOLE 40 MG/ML PO SUSR
150.0000 mg | ORAL | Status: DC
Start: 2013-04-12 — End: 2013-05-08

## 2013-04-12 NOTE — ED Notes (Signed)
Pt to department via EMS.  Per report pt has been having continued nasuea, vomiting and diarrhea today.  Pt denies pain at present.  Pt was admitted last month for same.  Pt is to have procedure in March due to gallbladder disease.  No distress noted at present time.

## 2013-04-12 NOTE — Discharge Instructions (Signed)
Urinary Tract Infection  Urinary tract infections (UTIs) can develop anywhere along your urinary tract. Your urinary tract is your body's drainage system for removing wastes and extra water. Your urinary tract includes two kidneys, two ureters, a bladder, and a urethra. Your kidneys are a pair of bean-shaped organs. Each kidney is about the size of your fist. They are located below your ribs, one on each side of your spine.  CAUSES  Infections are caused by microbes, which are microscopic organisms, including fungi, viruses, and bacteria. These organisms are so small that they can only be seen through a microscope. Bacteria are the microbes that most commonly cause UTIs.  SYMPTOMS   Symptoms of UTIs may vary by age and gender of the patient and by the location of the infection. Symptoms in young women typically include a frequent and intense urge to urinate and a painful, burning feeling in the bladder or urethra during urination. Older women and men are more likely to be tired, shaky, and weak and have muscle aches and abdominal pain. A fever may mean the infection is in your kidneys. Other symptoms of a kidney infection include pain in your back or sides below the ribs, nausea, and vomiting.  DIAGNOSIS  To diagnose a UTI, your caregiver will ask you about your symptoms. Your caregiver also will ask to provide a urine sample. The urine sample will be tested for bacteria and white blood cells. White blood cells are made by your body to help fight infection.  TREATMENT   Typically, UTIs can be treated with medication. Because most UTIs are caused by a bacterial infection, they usually can be treated with the use of antibiotics. The choice of antibiotic and length of treatment depend on your symptoms and the type of bacteria causing your infection.  HOME CARE INSTRUCTIONS   If you were prescribed antibiotics, take them exactly as your caregiver instructs you. Finish the medication even if you feel better after you  have only taken some of the medication.   Drink enough water and fluids to keep your urine clear or pale yellow.   Avoid caffeine, tea, and carbonated beverages. They tend to irritate your bladder.   Empty your bladder often. Avoid holding urine for long periods of time.   Empty your bladder before and after sexual intercourse.   After a bowel movement, women should cleanse from front to back. Use each tissue only once.  SEEK MEDICAL CARE IF:    You have back pain.   You develop a fever.   Your symptoms do not begin to resolve within 3 days.  SEEK IMMEDIATE MEDICAL CARE IF:    You have severe back pain or lower abdominal pain.   You develop chills.   You have nausea or vomiting.   You have continued burning or discomfort with urination.  MAKE SURE YOU:    Understand these instructions.   Will watch your condition.   Will get help right away if you are not doing well or get worse.  Document Released: 11/22/2004 Document Revised: 08/14/2011 Document Reviewed: 03/23/2011  Exit[employer] Patient Information 2014 [employer].

## 2013-04-12 NOTE — ED Provider Notes (Signed)
CSN: [id number]     Arrival date & time 04/12/13  1939 History   First MD Initiated Contact with Patient 04/12/13 1943     Chief Complaint  Patient presents with  . Nausea     (Consider location/radiation/quality/duration/timing/severity/associated sxs/prior Treatment) HPI Comments: Patient towards the right and once from home. Patient had been having nausea and then vomited earlier today. She is complaining of abdominal pain. She had some diarrhea as well. Upon arrival to the ER, however, patient indicates that she is feeling better. She denies pain and says she is no longer nauseated.   Past Medical History  Diagnosis Date  . GERD (gastroesophageal reflux disease)   . Hyperthyroidism   . Essential hypertension, benign   . Aortic atherosclerosis     Documented by CT imaging  . Gallstones    Past Surgical History  Procedure Laterality Date  . Breast surgery Left     60 years ago  . Abdominal hysterectomy    . Ercp  03/16/2013    Procedure: ENDOSCOPIC RETROGRADE CHOLANGIOPANCREATOGRAPHY (ERCP) (Duct not cleared of all stones);  Surgeon: Malissa HippoNajeeb U Rehman, MD;  Location: [facility];  Service: Endoscopy;;  . Sphincterotomy  03/16/2013    Procedure: Dennison MascotSPHINCTEROTOMY;  Surgeon: Malissa HippoNajeeb U Rehman, MD;  Location: [facility];  Service: Endoscopy;;  . Balloon dilation  03/16/2013    Procedure: Marvis RepressBALLOON DILATION;  Surgeon: Malissa HippoNajeeb U Rehman, MD;  Location: [facility];  Service: Endoscopy;;  . Removal of stones  03/16/2013    Procedure: REMOVAL OF STONES WITH STONE BASKET;  Surgeon: Malissa HippoNajeeb U Rehman, MD;  Location: [facility];  Service: Endoscopy;;  . Biliary stent placement  03/16/2013    Procedure: BILIARY STENT PLACEMENT;  Surgeon: Malissa HippoNajeeb U Rehman, MD;  Location: [facility];  Service: Endoscopy;;   History reviewed. No pertinent family history. History  Substance Use Topics  . Smoking status: Never Smoker   . Smokeless tobacco: Not on file  . Alcohol Use: No   OB History   Grav Para Term Preterm Abortions TAB SAB  Ect Mult Living                 Review of Systems  Gastrointestinal: Positive for nausea, abdominal pain and diarrhea.  All other systems reviewed and are negative.      Allergies  Review of patient's allergies indicates no known allergies.  Home Medications   Current Outpatient Rx  Name  Route  Sig  Dispense  Refill  . acidophilus (RISAQUAD) CAPS capsule   Oral   Take 1 capsule by mouth daily.   15 capsule   0   . acyclovir (ZOVIRAX) 800 MG tablet   Oral   Take 800 mg by mouth 2 (two) times daily.         Marland Kitchen. aspirin EC 81 MG EC tablet   Oral   Take 1 tablet (81 mg total) by mouth daily.   30 tablet   0   . beta carotene w/minerals (OCUVITE) tablet   Oral   Take 1 tablet by mouth 2 (two) times daily.         . Calcium Carbonate-Vitamin D (CALCIUM 600+D) 600-400 MG-UNIT per tablet   Oral   Take 1 tablet by mouth 2 (two) times daily.         . cyanocobalamin (,VITAMIN B-12,) 1000 MCG/ML injection   Intramuscular   Inject 1,000 mcg into the muscle every 30 (thirty) days.         . Difluprednate (DUREZOL)  0.05 % EMUL   Left Eye   Place 1 drop into the left eye daily.         . furosemide (LASIX) 20 MG tablet   Oral   Take 20 mg by mouth daily.         Marland Kitchen levothyroxine (SYNTHROID, LEVOTHROID) 50 MCG tablet   Oral   Take 50 mcg by mouth daily before breakfast.         . omeprazole (PRILOSEC) 20 MG capsule   Oral   Take 20 mg by mouth 2 (two) times daily.         . timolol (TIMOPTIC) 0.5 % ophthalmic solution   Right Eye   Place 1 drop into the right eye daily.         . Vitamin D, Ergocalciferol, (DRISDOL) 50000 UNITS CAPS capsule   Oral   Take 50,000 Units by mouth every 30 (thirty) days.          BP 119/100  Pulse 105  Temp(Src) 98.3 F (36.8 C) (Oral)  Resp 15  Ht 4\' 11"  (1.499 m)  Wt 93 lb (42.185 kg)  BMI 18.77 kg/m2  SpO2 98% Physical Exam  Constitutional: She is oriented to person, place, and time. No distress.   thin  HENT:  Head: Normocephalic and atraumatic.  Right Ear: Hearing normal.  Left Ear: Hearing normal.  Nose: Nose normal.  Mouth/Throat: Oropharynx is clear and moist and mucous membranes are normal.  Eyes: Conjunctivae and EOM are normal. Pupils are equal, round, and reactive to light.  Neck: Normal range of motion. Neck supple.  Cardiovascular: Regular rhythm, S1 normal and S2 normal.  Exam reveals no gallop and no friction rub.   No murmur heard. Pulmonary/Chest: Effort normal and breath sounds normal. No respiratory distress. She exhibits no tenderness.  Abdominal: Soft. Normal appearance and bowel sounds are normal. There is no hepatosplenomegaly. There is no tenderness. There is no rebound, no guarding, no tenderness at McBurney's point and negative Murphy's sign. No hernia.  Musculoskeletal: Normal range of motion.  Neurological: She is alert and oriented to person, place, and time. She has normal strength. No cranial nerve deficit or sensory deficit. Coordination normal. GCS eye subscore is 4. GCS verbal subscore is 5. GCS motor subscore is 6.  Skin: Skin is warm, dry and intact. No rash noted. No cyanosis.  Psychiatric: She has a normal mood and affect. Her speech is normal and behavior is normal. Thought content normal.    ED Course  Procedures (including critical care time) Labs Review Labs Reviewed  CBC WITH DIFFERENTIAL - Abnormal; Notable for the following:    WBC 11.3 (*)    MCV 109.5 (*)    MCH 37.1 (*)    Neutrophils Relative % 88 (*)    Neutro Abs 9.9 (*)    Lymphocytes Relative 6 (*)    All other components within normal limits  COMPREHENSIVE METABOLIC PANEL - Abnormal; Notable for the following:    Glucose, Bld 144 (*)    Creatinine, Ser 1.26 (*)    Albumin 3.1 (*)    Alkaline Phosphatase 159 (*)    GFR calc non Af Amer 36 (*)    GFR calc Af Amer 42 (*)    All other components within normal limits  URINALYSIS, ROUTINE W REFLEX MICROSCOPIC - Abnormal;  Notable for the following:    APPearance CLOUDY (*)    Hgb urine dipstick MODERATE (*)    Leukocytes, UA MODERATE (*)  All other components within normal limits  URINE MICROSCOPIC-ADD ON - Abnormal; Notable for the following:    Bacteria, UA MANY (*)    All other components within normal limits  URINE CULTURE  LIPASE, BLOOD   Imaging Review No results found.  EKG Interpretation   None       MDM   Final diagnoses:  UTI   Patient presents to the ER for evaluation of nausea, vomiting and diarrhea. She complained of abdominal discomfort earlier, but upon to the ER she feels better. Patient was recently diagnosed with common bile duct stone and is awaiting ERCP. She has been too frail to undergo any procedures. Family is concerned that the current symptoms may be related to her common bile duct stone. They're also concerned about possibility of dehydration.  Examination was unremarkable. Patient is in no distress. Abdominal exam is benign and nontender. Blood work is entirely unremarkable. I do not suspect this is secondary to her common bile duct stone or biliary infection or worsening of her condition. Her urinalysis shows significant infection. Patient was hydrated here in the ER and also administered Rocephin. She will be discharged on Ceftin, followup with primary care doctor this week.   Gilda Crease, MD 04/12/13 2155

## 2013-04-12 NOTE — ED Notes (Signed)
Family at bedside. Patient able to void on her own.

## 2013-04-13 LAB — URINE CULTURE: Colony Count: 15000

## 2013-04-15 ENCOUNTER — Encounter (HOSPITAL_COMMUNITY): Payer: Self-pay | Admitting: Pharmacy Technician

## 2013-05-08 ENCOUNTER — Encounter (HOSPITAL_COMMUNITY): Payer: Self-pay

## 2013-05-08 ENCOUNTER — Encounter (HOSPITAL_COMMUNITY)
Admission: RE | Admit: 2013-05-08 | Discharge: 2013-05-08 | Disposition: A | Discharge status: Home / Self Care | Payer: Medicare Other | Source: Ambulatory Visit | Attending: Internal Medicine | Admitting: Internal Medicine

## 2013-05-08 ENCOUNTER — Encounter (HOSPITAL_COMMUNITY): Payer: Self-pay | Admitting: Pharmacy Technician

## 2013-05-08 VITALS — Ht 59.0 in | Wt 100.4 lb

## 2013-05-08 DIAGNOSIS — Z01812 Encounter for preprocedural laboratory examination: Secondary | ICD-10-CM | POA: Insufficient documentation

## 2013-05-08 DIAGNOSIS — K8050 Calculus of bile duct without cholangitis or cholecystitis without obstruction: Secondary | ICD-10-CM

## 2013-05-08 HISTORY — DX: Nausea with vomiting, unspecified: Z98.890

## 2013-05-08 HISTORY — DX: Zoster ocular disease, unspecified: B02.30

## 2013-05-08 HISTORY — DX: Unspecified osteoarthritis, unspecified site: M19.90

## 2013-05-08 HISTORY — DX: Unspecified hearing loss, unspecified ear: H91.90

## 2013-05-08 HISTORY — DX: Nausea with vomiting, unspecified: R11.2

## 2013-05-08 HISTORY — DX: Adverse effect of unspecified anesthetic, initial encounter: T41.45XA

## 2013-05-08 HISTORY — DX: Other complications of anesthesia, initial encounter: T88.59XA

## 2013-05-08 HISTORY — DX: Unspecified glaucoma: H40.9

## 2013-05-08 LAB — COMPREHENSIVE METABOLIC PANEL
ALBUMIN: 2.3 g/dL — AB (ref 3.5–5.2)
ALK PHOS: 76 U/L (ref 39–117)
ALT: 7 U/L (ref 0–35)
AST: 18 U/L (ref 0–37)
BILIRUBIN TOTAL: 0.3 mg/dL (ref 0.3–1.2)
BUN: 9 mg/dL (ref 6–23)
CHLORIDE: 110 meq/L (ref 96–112)
CO2: 30 mEq/L (ref 19–32)
Calcium: 9.3 mg/dL (ref 8.4–10.5)
Creatinine, Ser: 1.04 mg/dL (ref 0.50–1.10)
GFR calc Af Amer: 53 mL/min — ABNORMAL LOW (ref >90)
GFR calc non Af Amer: 46 mL/min — ABNORMAL LOW (ref >90)
Glucose, Bld: 182 mg/dL — ABNORMAL HIGH (ref 70–99)
POTASSIUM: 4.5 meq/L (ref 3.7–5.3)
SODIUM: 148 meq/L — AB (ref 137–147)
TOTAL PROTEIN: 6 g/dL (ref 6.0–8.3)

## 2013-05-08 LAB — CBC
HEMATOCRIT: 37.3 % (ref 36.0–46.0)
Hemoglobin: 12.3 g/dL (ref 12.0–15.0)
MCH: 34.7 pg — ABNORMAL HIGH (ref 26.0–34.0)
MCHC: 33 g/dL (ref 30.0–36.0)
MCV: 105.4 fL — ABNORMAL HIGH (ref 78.0–100.0)
PLATELETS: 315 10*3/uL (ref 150–400)
RBC: 3.54 MIL/uL — ABNORMAL LOW (ref 3.87–5.11)
RDW: 13.1 % (ref 11.5–15.5)
WBC: 7.7 10*3/uL (ref 4.0–10.5)

## 2013-05-08 NOTE — Patient Instructions (Signed)
Erica Dunn  05/08/2013   Your procedure is scheduled on:   05/14/2013  Report to [facility] at  615  AM.  Call this number if you have problems the morning of surgery: [phone]   Remember:   Do not eat food or drink liquids after midnight.   Take these medicines the morning of surgery with A SIP OF WATER:  Synthroid, prilosec   Do not wear jewelry, make-up or nail polish.  Do not wear lotions, powders, or perfumes.   Do not shave 48 hours prior to surgery. Men may shave face and neck.  Do not bring valuables to the hospital.  [facility] is not responsible for any belongings or valuables.               Contacts, dentures or bridgework may not be worn into surgery.  Leave suitcase in the car. After surgery it may be brought to your room.  For patients admitted to the hospital, discharge time is determined by your treatment team.               Patients discharged the day of surgery will not be allowed to drive  home.  Name and phone number of your driver: family  Special Instructions: N/A   Please read over the following fact sheets that you were given: Pain Booklet, Coughing and Deep Breathing, Surgical Site Infection Prevention, Anesthesia Post-op Instructions and Care and Recovery After Surgery Endoscopic Retrograde Cholangiopancreatography (ERCP) Endoscopic retrograde cholangiopancreatography (ERCP) is a procedure used to diagnosis many diseases of the pancreas, bile ducts, liver, and gallbladder. During ERCP a thin, lighted tube (endoscope) is passed through the mouth and down the back of the throat into the first part of the small intestine (duodenum). A small, plastic tube (cannula) is then passed through the endoscope and directed into the bile duct or pancreatic duct. Dye is then injected through the cannula and X-rays are taken to study the biliary and pancreatic passageways.  LET [facility] CARE PROVIDER KNOW ABOUT:   Any allergies you have.   All medicines you  are taking, including vitamins, herbs, eyedrops, creams, and over-the-counter medicines.   Previous problems you or members of your family have had with the use of anesthetics.   Any blood disorders you have.   Previous surgeries you have had.   Medical conditions you have. RISKS AND COMPLICATIONS Generally, ERCP is a safe procedure. However, as with any procedure, complications can occur. A simple removal of gallstones has the lowest rate of complications. Higher rates of complication occur in people who have poorly functioning bile or pancreatic ducts. Possible complications include:   Pancreatitis.  Bleeding.  Accidental punctures in the bowel wall, pancreas, or gall bladder.  Gall bladder or bile duct infection. BEFORE THE PROCEDURE   Do not eat or drink anything, including water, for at least 8 hours before the procedure or as directed by your health care provider.   Ask your health care provider whether you should stop taking certain medicines prior to your procedure.   Arrange for someone to drive you home. You will not be allowed to drive for 12 24 hours after the procedure. PROCEDURE   You will be given medicine through a vein (intravenously) to make you relaxed and sleepy.   You might have a breathing tube placed to give you medicine that makes you sleep (Dunn anesthetic).   Your throat may be sprayed with medicine that numbs the area and  prevents gagging (local anesthetic), or you may gargle this medicine.   You will lie on your left side.   The endoscope will be inserted through your mouth and into the duodenum. The tube will not interfere with your breathing. Gagging is prevented by the anesthesia.   While X-rays are being taken, you may be positioned on your stomach.   A small sample of tissue (biopsy) may be removed for examination. AFTER THE PROCEDURE   You will rest in bed until you are fully conscious.   When you first wake up, your  throat may feel slightly sore.   You will not be allowed to eat or drink until numbness subsides.   Once you are able to drink, urinate, and sit on the edge of the bed without feeling sick to your stomach (nauseous) or dizzy, you may be allowed to go home. Document Released: 11/07/2000 Document Revised: 12/03/2012 Document Reviewed: 09/23/2012 [facility] Patient Information 2014 [employer], [place]. PATIENT INSTRUCTIONS POST-ANESTHESIA  IMMEDIATELY FOLLOWING SURGERY:  Do not drive or operate machinery for the first twenty four hours after surgery.  Do not make any important decisions for twenty four hours after surgery or while taking narcotic pain medications or sedatives.  If you develop intractable nausea and vomiting or a severe headache please notify your doctor immediately.  FOLLOW-UP:  Please make an appointment with your surgeon as instructed. You do not need to follow up with anesthesia unless specifically instructed to do so.  WOUND CARE INSTRUCTIONS (if applicable):  Keep a dry clean dressing on the anesthesia/puncture wound site if there is drainage.  Once the wound has quit draining you may leave it open to air.  Generally you should leave the bandage intact for twenty four hours unless there is drainage.  If the epidural site drains for more than 36-48 hours please call the anesthesia department.  QUESTIONS?:  Please feel free to call your physician or the hospital operator if you have any questions, and they will be happy to assist you.

## 2013-05-08 NOTE — Pre-Procedure Instructions (Signed)
Patient given information to sign up for my chart at home. 

## 2013-05-14 ENCOUNTER — Ambulatory Visit (HOSPITAL_COMMUNITY): Payer: Medicare Other | Admitting: Anesthesiology

## 2013-05-14 ENCOUNTER — Encounter (HOSPITAL_COMMUNITY): Payer: Self-pay | Admitting: *Deleted

## 2013-05-14 ENCOUNTER — Encounter (HOSPITAL_COMMUNITY)
Admission: RE | Disposition: A | Discharge status: Home / Self Care | Payer: Self-pay | Source: Ambulatory Visit | Attending: Nurse Practitioner

## 2013-05-14 ENCOUNTER — Ambulatory Visit (HOSPITAL_COMMUNITY): Payer: Medicare Other

## 2013-05-14 ENCOUNTER — Observation Stay (HOSPITAL_COMMUNITY)
Admission: RE | Admit: 2013-05-14 | Discharge: 2013-05-15 | Disposition: A | Discharge status: Home / Self Care | Payer: Medicare Other | Source: Ambulatory Visit | Attending: Nurse Practitioner | Admitting: Nurse Practitioner

## 2013-05-14 ENCOUNTER — Encounter (HOSPITAL_COMMUNITY): Payer: Medicare Other | Admitting: Anesthesiology

## 2013-05-14 DIAGNOSIS — I5032 Chronic diastolic (congestive) heart failure: Secondary | ICD-10-CM | POA: Insufficient documentation

## 2013-05-14 DIAGNOSIS — K219 Gastro-esophageal reflux disease without esophagitis: Secondary | ICD-10-CM | POA: Insufficient documentation

## 2013-05-14 DIAGNOSIS — I700 Atherosclerosis of aorta: Secondary | ICD-10-CM | POA: Insufficient documentation

## 2013-05-14 DIAGNOSIS — H409 Unspecified glaucoma: Secondary | ICD-10-CM | POA: Insufficient documentation

## 2013-05-14 DIAGNOSIS — E039 Hypothyroidism, unspecified: Secondary | ICD-10-CM | POA: Insufficient documentation

## 2013-05-14 DIAGNOSIS — I10 Essential (primary) hypertension: Secondary | ICD-10-CM | POA: Insufficient documentation

## 2013-05-14 DIAGNOSIS — K8070 Calculus of gallbladder and bile duct without cholecystitis without obstruction: Secondary | ICD-10-CM

## 2013-05-14 DIAGNOSIS — K5710 Diverticulosis of small intestine without perforation or abscess without bleeding: Secondary | ICD-10-CM

## 2013-05-14 DIAGNOSIS — E0590 Thyrotoxicosis, unspecified without thyrotoxic crisis or storm: Secondary | ICD-10-CM | POA: Insufficient documentation

## 2013-05-14 DIAGNOSIS — K8050 Calculus of bile duct without cholangitis or cholecystitis without obstruction: Secondary | ICD-10-CM

## 2013-05-14 DIAGNOSIS — Z7982 Long term (current) use of aspirin: Secondary | ICD-10-CM | POA: Insufficient documentation

## 2013-05-14 HISTORY — PX: STENT REMOVAL: SHX6421

## 2013-05-14 HISTORY — DX: Hypothyroidism, unspecified: E03.9

## 2013-05-14 HISTORY — PX: SPHINCTEROTOMY: SHX5544

## 2013-05-14 HISTORY — PX: ERCP: SHX5425

## 2013-05-14 HISTORY — PX: REMOVAL OF STONES: SHX5545

## 2013-05-14 LAB — POCT I-STAT 4, (NA,K, GLUC, HGB,HCT)
Glucose, Bld: 81 mg/dL (ref 70–99)
HCT: 37 % (ref 36.0–46.0)
Hemoglobin: 12.6 g/dL (ref 12.0–15.0)
Potassium: 4.2 mEq/L (ref 3.7–5.3)
SODIUM: 144 meq/L (ref 137–147)

## 2013-05-14 SURGERY — ERCP, WITH INTERVENTION IF INDICATED
Anesthesia: General

## 2013-05-14 MED ORDER — ROCURONIUM BROMIDE 100 MG/10ML IV SOLN
INTRAVENOUS | Status: DC | PRN
  Administered 2013-05-14: 5 mg via INTRAVENOUS

## 2013-05-14 MED ORDER — ACETYLCYSTEINE 20 % IN SOLN
RESPIRATORY_TRACT | Status: AC
  Filled 2013-05-14: qty 4

## 2013-05-14 MED ORDER — MEGESTROL ACETATE 400 MG/10ML PO SUSP
400.0000 mg | Freq: Two times a day (BID) | ORAL | Status: DC
  Administered 2013-05-14 – 2013-05-15 (×3): 400 mg via ORAL
  Filled 2013-05-14 (×9): qty 10

## 2013-05-14 MED ORDER — CEFAZOLIN SODIUM-DEXTROSE 2-3 GM-% IV SOLR
2.0000 g | INTRAVENOUS | Status: AC
  Administered 2013-05-14: 2 g via INTRAVENOUS
  Filled 2013-05-14: qty 50

## 2013-05-14 MED ORDER — ONDANSETRON HCL 4 MG/2ML IJ SOLN: 4.0000 mg | Freq: Once | INTRAMUSCULAR | Status: DC | PRN

## 2013-05-14 MED ORDER — SODIUM CHLORIDE 0.9 % IV SOLN
INTRAVENOUS | Status: DC | PRN
  Administered 2013-05-14: 08:00:00

## 2013-05-14 MED ORDER — ACETAMINOPHEN 325 MG PO TABS: 325.0000 mg | ORAL_TABLET | Freq: Four times a day (QID) | ORAL | Status: DC | PRN

## 2013-05-14 MED ORDER — SUCCINYLCHOLINE CHLORIDE 20 MG/ML IJ SOLN
INTRAMUSCULAR | Status: DC | PRN
  Administered 2013-05-14: 80 mg via INTRAVENOUS

## 2013-05-14 MED ORDER — FENTANYL CITRATE 0.05 MG/ML IJ SOLN: 25.0000 ug | INTRAMUSCULAR | Status: DC | PRN

## 2013-05-14 MED ORDER — ACETAMINOPHEN 650 MG RE SUPP
650.0000 mg | Freq: Four times a day (QID) | RECTAL | Status: DC | PRN
  Filled 2013-05-14: qty 1

## 2013-05-14 MED ORDER — FUROSEMIDE 20 MG PO TABS
20.0000 mg | ORAL_TABLET | Freq: Every day | ORAL | Status: DC
  Administered 2013-05-14 – 2013-05-15 (×2): 20 mg via ORAL
  Filled 2013-05-14 (×2): qty 1

## 2013-05-14 MED ORDER — ETOMIDATE 2 MG/ML IV SOLN
INTRAVENOUS | Status: DC | PRN
  Administered 2013-05-14: 8 mg via INTRAVENOUS

## 2013-05-14 MED ORDER — LEVOTHYROXINE SODIUM 50 MCG PO TABS
50.0000 ug | ORAL_TABLET | Freq: Every day | ORAL | Status: DC
  Administered 2013-05-15: 50 ug via ORAL
  Filled 2013-05-14: qty 1

## 2013-05-14 MED ORDER — MIDAZOLAM HCL 2 MG/2ML IJ SOLN
1.0000 mg | INTRAMUSCULAR | Status: DC | PRN
  Administered 2013-05-14: 1 mg via INTRAVENOUS

## 2013-05-14 MED ORDER — ACYCLOVIR 800 MG PO TABS
800.0000 mg | ORAL_TABLET | Freq: Two times a day (BID) | ORAL | Status: DC
  Administered 2013-05-14 – 2013-05-15 (×2): 800 mg via ORAL
  Filled 2013-05-14 (×9): qty 1

## 2013-05-14 MED ORDER — TIMOLOL MALEATE 0.5 % OP SOLN
1.0000 [drp] | Freq: Every day | OPHTHALMIC | Status: DC
  Administered 2013-05-15: 1 [drp] via OPHTHALMIC
  Filled 2013-05-14: qty 5

## 2013-05-14 MED ORDER — GLUCAGON HCL (RDNA) 1 MG IJ SOLR
INTRAMUSCULAR | Status: AC
  Filled 2013-05-14: qty 2

## 2013-05-14 MED ORDER — MIDAZOLAM HCL 2 MG/2ML IJ SOLN
INTRAMUSCULAR | Status: AC
  Filled 2013-05-14: qty 2

## 2013-05-14 MED ORDER — LACTATED RINGERS IV SOLN
INTRAVENOUS | Status: DC
  Administered 2013-05-14: 08:00:00 via INTRAVENOUS

## 2013-05-14 MED ORDER — SODIUM CHLORIDE 0.9 % IV SOLN
INTRAVENOUS | Status: AC
  Filled 2013-05-14: qty 50

## 2013-05-14 MED ORDER — PREDNISOLONE ACETATE 1 % OP SUSP
1.0000 [drp] | Freq: Every day | OPHTHALMIC | Status: DC
  Administered 2013-05-15: 1 [drp] via OPHTHALMIC
  Filled 2013-05-14: qty 1

## 2013-05-14 MED ORDER — SODIUM CHLORIDE 0.45 % IV SOLN
INTRAVENOUS | Status: DC
  Administered 2013-05-14 – 2013-05-15 (×2): via INTRAVENOUS

## 2013-05-14 MED ORDER — ENALAPRIL MALEATE 5 MG PO TABS
2.5000 mg | ORAL_TABLET | Freq: Every day | ORAL | Status: DC
  Administered 2013-05-14 – 2013-05-15 (×2): 2.5 mg via ORAL
  Filled 2013-05-14 (×2): qty 1

## 2013-05-14 MED ORDER — SODIUM CHLORIDE 0.9 % IJ SOLN
3.0000 mL | Freq: Two times a day (BID) | INTRAMUSCULAR | Status: DC
  Administered 2013-05-15: 3 mL via INTRAVENOUS

## 2013-05-14 MED ORDER — GLUCAGON HCL (RDNA) 1 MG IJ SOLR
INTRAMUSCULAR | Status: DC | PRN
  Administered 2013-05-14 (×4): 0.25 mg via INTRAVENOUS

## 2013-05-14 MED ORDER — PANTOPRAZOLE SODIUM 40 MG PO TBEC
40.0000 mg | DELAYED_RELEASE_TABLET | Freq: Every day | ORAL | Status: DC
  Administered 2013-05-15: 40 mg via ORAL
  Filled 2013-05-14: qty 1

## 2013-05-14 MED ORDER — FENTANYL CITRATE 0.05 MG/ML IJ SOLN
INTRAMUSCULAR | Status: DC | PRN
  Administered 2013-05-14 (×2): 25 ug via INTRAVENOUS

## 2013-05-14 MED ORDER — STERILE WATER FOR IRRIGATION IR SOLN
Status: DC | PRN
  Administered 2013-05-14: 08:00:00

## 2013-05-14 MED ORDER — FENTANYL CITRATE 0.05 MG/ML IJ SOLN
INTRAMUSCULAR | Status: AC
  Filled 2013-05-14: qty 5

## 2013-05-14 MED ORDER — LIDOCAINE HCL 1 % IJ SOLN
INTRAMUSCULAR | Status: DC | PRN
  Administered 2013-05-14: 20 mg via INTRADERMAL

## 2013-05-14 SURGICAL SUPPLY — 33 items
BAG HAMPER (MISCELLANEOUS) ×3 IMPLANT
BALLN CRE LF 10-12 240X5.5 (BALLOONS) ×3
BALLN RETRIEVAL 12X15 (BALLOONS) ×1 IMPLANT
BALLOON CRE LF 10-12 240X5.5 (BALLOONS) IMPLANT
BALN RTRVL 200 6-7FR 12-15 (BALLOONS) ×2
BASKET TRAPEZOID 3X6 (MISCELLANEOUS) IMPLANT
BASKET TRAPEZOID LITHO 2.5X5 (MISCELLANEOUS) ×1 IMPLANT
BSKT STON RTRVL TRAPEZOID 3X6 (MISCELLANEOUS)
DEVICE INFLATION ENCORE 26 (MISCELLANEOUS) ×1 IMPLANT
DEVICE LOCKING W-BIOPSY CAP (MISCELLANEOUS) ×3 IMPLANT
GUIDEWIRE HYDRA JAGWIRE .35 (WIRE) IMPLANT
GUIDEWIRE JAG HINI 025X260CM (WIRE) IMPLANT
KIT CLEAN ENDO COMPLIANCE (KITS) ×3 IMPLANT
KIT ROOM TURNOVER APOR (KITS) ×3 IMPLANT
LUBRICANT JELLY 4.5OZ STERILE (MISCELLANEOUS) ×1 IMPLANT
NDL HYPO 18GX1.5 BLUNT FILL (NEEDLE) IMPLANT
NEEDLE HYPO 18GX1.5 BLUNT FILL (NEEDLE) ×3 IMPLANT
PAD ARMBOARD 7.5X6 YLW CONV (MISCELLANEOUS) ×5 IMPLANT
PATHFINDER 450CM 0.18 (STENTS) IMPLANT
POSITIONER HEAD 8X9X4 ADT (SOFTGOODS) IMPLANT
SNARE ROTATE MED OVAL 20MM (MISCELLANEOUS) ×1 IMPLANT
SNARE SHORT THROW 13M SML OVAL (MISCELLANEOUS) ×2 IMPLANT
SPHINCTEROTOME AUTOTOME .25 (MISCELLANEOUS) ×4 IMPLANT
SPHINCTEROTOME HYDRATOME 44 (MISCELLANEOUS) ×5 IMPLANT
SPONGE GAUZE 4X4 12PLY (GAUZE/BANDAGES/DRESSINGS) ×3 IMPLANT
SYR 3ML LL SCALE MARK (SYRINGE) IMPLANT
SYR 50ML LL SCALE MARK (SYRINGE) ×2 IMPLANT
SYR INFLATION 60ML (SYRINGE) ×1 IMPLANT
SYSTEM CONTINUOUS INJECTION (MISCELLANEOUS) ×3 IMPLANT
TUBING ENDO SMARTCAP PENTAX (MISCELLANEOUS) ×3 IMPLANT
WALLSTENT METAL COVERED 10X60 (STENTS) IMPLANT
WALLSTENT METAL COVERED 10X80 (STENTS) IMPLANT
WATER STERILE IRR 1000ML POUR (IV SOLUTION) ×4 IMPLANT

## 2013-05-14 NOTE — OR Nursing (Signed)
Pt swallowing without difficulty.

## 2013-05-14 NOTE — Anesthesia Postprocedure Evaluation (Signed)
  Anesthesia Post-op Note  Patient: Erica Dunn  Procedure(s) Performed: Procedure(s) with comments: ENDOSCOPIC RETROGRADE CHOLANGIOPANCREATOGRAPHY (ERCP) (N/A) - 730 REMOVAL OF STONES (N/A) STENT REMOVAL (N/A)  EXTENSION OF SPHINCTEROTOMY  Patient Location: PACU  Anesthesia Type:General  Level of Consciousness: awake, alert  and oriented  Airway and Oxygen Therapy: Patient Spontanous Breathing and Patient connected to face mask oxygen  Post-op Pain: none  Post-op Assessment: Post-op Vital signs reviewed, Patient's Cardiovascular Status Stable, Respiratory Function Stable, Patent Airway and No signs of Nausea or vomiting  Post-op Vital Signs: Reviewed and stable  Complications: No apparent anesthesia complications

## 2013-05-14 NOTE — H&P (Signed)
Erica Dunn is an 80 y.o. female.   Chief Complaint: Patient is here for ERCP stent and stone removal. HPI: Patient is a 80 year old Caucasian female who was admitted to this facility about 2 months ago with cholangitis. She underwent ERCP and found to have 2 stones. Few stone fragments were removed but duct could not be cleared of stones therefore stent was left in place. Post procedure she had respiratory problems possibly related to CHF due to diastolic dysfunction. She improved and was discharged. She has not experienced chest pain shortness of breath or abdominal pain. Her appetite however has been poor. She is returning for repeat ERCP so tha stent and stones could be removed. Patient will be monitored in the hospital overnight. Lab data from 05/08/2013 pertinent for serum sodium 148 and glucose of 182 and albumin of 2.3. AST and ALT were 18 and 7 and alkaline phosphatase 76 with bilirubin of 0.3. CBC revealed WBC of 7.7 H&H of 12.3 and 37.3 and platelet count of 315K    Past Medical History  Diagnosis Date  . GERD (gastroesophageal reflux disease)   . Hyperthyroidism   . Essential hypertension, benign   . Aortic atherosclerosis     Documented by CT imaging  . Gallstones   . HOH (hard of hearing)   . Complication of anesthesia   . PONV (postoperative nausea and vomiting)   . Herpes ocular   . Arthritis   . Glaucoma     Past Surgical History  Procedure Laterality Date  . Breast surgery Left     60 years ago  . Abdominal hysterectomy    . Ercp  03/16/2013    Procedure: ENDOSCOPIC RETROGRADE CHOLANGIOPANCREATOGRAPHY (ERCP) (Duct not cleared of all stones);  Surgeon: Malissa Hippo, MD;  Location: [facility];  Service: Endoscopy;;  . Sphincterotomy  03/16/2013    Procedure: Dennison Mascot;  Surgeon: Malissa Hippo, MD;  Location: [facility];  Service: Endoscopy;;  . Balloon dilation  03/16/2013    Procedure: Marvis Repress DILATION;  Surgeon: Malissa Hippo, MD;  Location: [facility];  Service:  Endoscopy;;  . Removal of stones  03/16/2013    Procedure: REMOVAL OF STONES WITH STONE BASKET;  Surgeon: Malissa Hippo, MD;  Location: [facility];  Service: Endoscopy;;  . Biliary stent placement  03/16/2013    Procedure: BILIARY STENT PLACEMENT;  Surgeon: Malissa Hippo, MD;  Location: [facility];  Service: Endoscopy;;    History reviewed. No pertinent family history. Social History:  reports that she has never smoked. She does not have any smokeless tobacco history on file. She reports that she does not drink alcohol or use illicit drugs.  Allergies: No Known Allergies  Medications Prior to Admission  Medication Sig Dispense Refill  . acyclovir (ZOVIRAX) 800 MG tablet Take 800 mg by mouth 2 (two) times daily.      Marland Kitchen aspirin EC 81 MG EC tablet Take 1 tablet (81 mg total) by mouth daily.  30 tablet  0  . beta carotene w/minerals (OCUVITE) tablet Take 1 tablet by mouth 2 (two) times daily.      . Calcium Carbonate-Vitamin D (CALCIUM 600+D) 600-400 MG-UNIT per tablet Take 1 tablet by mouth 2 (two) times daily.      . cyanocobalamin (,VITAMIN B-12,) 1000 MCG/ML injection Inject 1,000 mcg into the muscle every 30 (thirty) days.      . Difluprednate (DUREZOL) 0.05 % EMUL Place 1 drop into the left eye daily.      Marland Kitchen  furosemide (LASIX) 20 MG tablet Take 20 mg by mouth daily.      Marland Kitchen. levothyroxine (SYNTHROID, LEVOTHROID) 50 MCG tablet Take 50 mcg by mouth daily before breakfast.      . megestrol (MEGACE) 40 MG/ML suspension Take 400 mg by mouth 2 (two) times daily.      Marland Kitchen. omeprazole (PRILOSEC) 20 MG capsule Take 20 mg by mouth 2 (two) times daily.      . timolol (TIMOPTIC) 0.5 % ophthalmic solution Place 1 drop into the right eye daily.      . Vitamin D, Ergocalciferol, (DRISDOL) 50000 UNITS CAPS capsule Take 50,000 Units by mouth every 30 (thirty) days.        No results found for this or any previous visit (from the past 48 hour(s)). No results found.  ROS  Blood pressure 161/82, pulse 72,  temperature 98.2 F (36.8 C), temperature source Oral, resp. rate 36, SpO2 95.00%. Physical Exam  Constitutional:  Elderly well-developed thin Caucasian female in NAD  HENT:  Mouth/Throat: Oropharynx is clear and moist.  Eyes: Conjunctivae are normal. No scleral icterus.  Neck: No thyromegaly present.  Cardiovascular:  Irregular rhythm and normal S1 and S2. No murmur or gallop noted.  Respiratory: Effort normal and breath sounds normal.  GI: Soft. She exhibits no distension and no mass. There is no tenderness.  Musculoskeletal: Edema: trace edema around ankles.  Lymphadenopathy:    She has no cervical adenopathy.  Neurological: She is alert.  Skin: Skin is warm and dry.     Assessment/Plan History of cholangitis secondary to large common duct stones. Status post ERCP with months ago with partial clearing of bile duct. ERCP with stent and stone removal. Patient may need lithotripsy. Patient will be monitored in the hospital overnight.  REHMAN,NAJEEB U 05/14/2013, 7:26 AM

## 2013-05-14 NOTE — Anesthesia Procedure Notes (Signed)
Procedure Name: Intubation Date/Time: 05/14/2013 8:01 AM Performed by: Glynn OctaveANIEL, Kamarie Palma E Pre-anesthesia Checklist: Patient identified, Patient being monitored, Timeout performed, Emergency Drugs available and Suction available Patient Re-evaluated:Patient Re-evaluated prior to inductionOxygen Delivery Method: Circle System Utilized Preoxygenation: Pre-oxygenation with 100% oxygen Intubation Type: IV induction, Rapid sequence and Cricoid Pressure applied Ventilation: Mask ventilation without difficulty Laryngoscope Size: Mac and 3 Grade View: Grade II Tube type: Oral Tube size: 7.0 mm Number of attempts: 1 Airway Equipment and Method: stylet Placement Confirmation: ETT inserted through vocal cords under direct vision,  positive ETCO2 and breath sounds checked- equal and bilateral Secured at: 21 cm Tube secured with: Tape Dental Injury: Teeth and Oropharynx as per pre-operative assessment

## 2013-05-14 NOTE — Anesthesia Preprocedure Evaluation (Addendum)
Anesthesia Evaluation  Patient identified by MRN, date of birth, ID band Patient awake    Reviewed: Allergy & Precautions, H&P , NPO status , Patient's Chart, lab work & pertinent test results  History of Anesthesia Complications (+) PONV and history of anesthetic complications  Airway Mallampati: III TM Distance: <3 FB Neck ROM: Full    Dental  (+) Teeth Intact   Pulmonary neg pulmonary ROS,  breath sounds clear to auscultation        Cardiovascular hypertension, Pt. on medications Rhythm:Regular Rate:Normal     Neuro/Psych    GI/Hepatic GERD-  Medicated and Controlled,  Endo/Other  Hypothyroidism   Renal/GU      Musculoskeletal   Abdominal   Peds  Hematology   Anesthesia Other Findings   Reproductive/Obstetrics                          Anesthesia Physical Anesthesia Plan  ASA: III and emergent  Anesthesia Plan: General   Post-op Pain Management:    Induction: Intravenous, Rapid sequence and Cricoid pressure planned  Airway Management Planned: Oral ETT  Additional Equipment:   Intra-op Plan:   Post-operative Plan: Extubation in OR  Informed Consent: I have reviewed the patients History and Physical, chart, labs and discussed the procedure including the risks, benefits and alternatives for the proposed anesthesia with the patient or authorized representative who has indicated his/her understanding and acceptance.     Plan Discussed with: CRNA  Anesthesia Plan Comments: (amidate induction Pt became hypoxic with hypoventilation in ICU several hours after the procedure last time, and the family was told she "got too much anesthesia". Previously in PACU she had stable VS and good ventilation.)       Anesthesia Quick Evaluation

## 2013-05-14 NOTE — Progress Notes (Signed)
Present with patient and family for support.  

## 2013-05-14 NOTE — Transfer of Care (Signed)
Immediate Anesthesia Transfer of Care Note  Patient: Erica Dunn  Procedure(s) Performed: Procedure(s) with comments: ENDOSCOPIC RETROGRADE CHOLANGIOPANCREATOGRAPHY (ERCP) (N/A) - 730 REMOVAL OF STONES (N/A) STENT REMOVAL (N/A)  EXTENSION OF SPHINCTEROTOMY  Patient Location: PACU  Anesthesia Type:General  Level of Consciousness: awake and alert   Airway & Oxygen Therapy: Patient Spontanous Breathing and Patient connected to face mask oxygen  Post-op Assessment: Report given to PACU RN  Post vital signs: Reviewed and stable  Complications: No apparent anesthesia complications

## 2013-05-14 NOTE — Progress Notes (Signed)
Patient admitted to observation following ERCP. She has no complaints; she denies nausea vomiting chest or abdominal pain. She also denies dyspnea. Patient's blood pressure has been elevated. She has history of hypertension and was on medication previously which was discontinued. Will begin patient on enalapril 2.5 mg by mouth daily. Diet advance to full liquids. Imaging studies reviewed with the patient's daughters and granddaughter. For lab in a.m. and she should be able to go home.

## 2013-05-14 NOTE — Op Note (Signed)
ERCP PROCEDURE REPORT  PATIENT:  Erica Dunn  MR#:  [id number] Birthdate:  03/16/1920, 80 y.o., female Endoscopist:  Dr. Malissa HippoNajeeb U. Rehman, MD Referred By:  Ms. Erasmo DownerLindsey F. Strader, NP  Procedure Date: 05/14/2013  Procedure:   ERCP with stent, extension of sphincterotomy and stone removal.    Indications:  Patient is 80 year-old Caucasian female who presented with: Jonny RuizJohn is about 2 months ago and underwent ERCP and found to have 2 large stones. Stone fragments removed but  duct could not be clear of the stone. Therefore stent was left in place. She is now returning for a stent and stone removal.           Informed Consent:  The risks, benefits, limitations, alternatives, and mponderable have been reviewed with the patient. I specifically discussed a 1 in 10 chance of pancreatitis, reaction to medications, bleeding, perforation and the possibility of a failed ERCP. Potential for sphincterotomy and stent placement also reviewed. Questions have been answered. All parties agreeable.  Please see history & physical in medical record for more information.  Medications:  *Please see anesthesia record for complete details  Description of procedure:  Procedure performed in the OR. The patient was placed under anesthesia, intubated, and turned into semipermanent position. Therapeutic Pentax video duodenoscope passed through the oropharynx without any difficulty into the esophagus, stomach, and across the pylorus and pull, and descending duodenum.    Findings:  Food debris noted in the stomach but pylorus was patent. Multiple duodenal diverticula noted. Biliary stent removed using snare under fluoroscopic control. Cholangiogram obtained using dilute contrast. Dilated CBD and CHD with small filling defects distally. Previously noted narrowing and proximal hepatic duct had resolved(from distended GB). Long cystic duct with low takeoff and filling defect in  It. Multiple stones noted in gallbladder. Biliary  sphincterotomy extended period. Dormia basket passed through bile duct few times but no stones retrieved. Occlusion cholangiogram obtained using stone balloon extractor. As the balloon was trolled through the duct 2 small stones came out.  Therapeutic/Diagnostic Maneuvers Performed:  See above  Complications:  None  Impression:  Food debris in the stomach with patent pylorus. Multiple duodenal diverticula. Biliary stent removed as above. Dilated biliary system with two small stones which were removed. Long cystic duct with low takeoff and multiple gallstones.   Recommendations:  Patient will be observed in the hospital overnight. Clear liquids today. No aspirin for 5 days.  REHMAN,NAJEEB U  05/14/2013  9:10 AM  CC: Dr. Iran OuchStrader, Alleen BorneLindsey F, NP & Dr. Bonnetta BarryNo ref. provider found

## 2013-05-14 NOTE — Progress Notes (Signed)
Patients home medications not ordered and BP running elevated with SBP 170's. Dr Karilyn Cotaehman notified. Will continue to monitor and he will address home medications when he is out of OR.

## 2013-05-15 ENCOUNTER — Encounter (HOSPITAL_COMMUNITY): Payer: Self-pay | Admitting: Internal Medicine

## 2013-05-15 DIAGNOSIS — K8070 Calculus of gallbladder and bile duct without cholecystitis without obstruction: Secondary | ICD-10-CM

## 2013-05-15 DIAGNOSIS — I10 Essential (primary) hypertension: Secondary | ICD-10-CM

## 2013-05-15 LAB — COMPREHENSIVE METABOLIC PANEL
ALBUMIN: 2.2 g/dL — AB (ref 3.5–5.2)
ALK PHOS: 52 U/L (ref 39–117)
ALT: <5 U/L (ref 0–35)
AST: 17 U/L (ref 0–37)
BUN: 11 mg/dL (ref 6–23)
CHLORIDE: 106 meq/L (ref 96–112)
CO2: 31 mEq/L (ref 19–32)
Calcium: 8.8 mg/dL (ref 8.4–10.5)
Creatinine, Ser: 1.05 mg/dL (ref 0.50–1.10)
GFR calc Af Amer: 53 mL/min — ABNORMAL LOW (ref >90)
GFR calc non Af Amer: 45 mL/min — ABNORMAL LOW (ref >90)
Glucose, Bld: 79 mg/dL (ref 70–99)
Potassium: 4.9 mEq/L (ref 3.7–5.3)
Sodium: 144 mEq/L (ref 137–147)
TOTAL PROTEIN: 5.8 g/dL — AB (ref 6.0–8.3)
Total Bilirubin: 0.4 mg/dL (ref 0.3–1.2)

## 2013-05-15 LAB — CBC
HEMATOCRIT: 35.5 % — AB (ref 36.0–46.0)
Hemoglobin: 11.8 g/dL — ABNORMAL LOW (ref 12.0–15.0)
MCH: 35 pg — AB (ref 26.0–34.0)
MCHC: 33.2 g/dL (ref 30.0–36.0)
MCV: 105.3 fL — AB (ref 78.0–100.0)
Platelets: 241 10*3/uL (ref 150–400)
RBC: 3.37 MIL/uL — ABNORMAL LOW (ref 3.87–5.11)
RDW: 13.6 % (ref 11.5–15.5)
WBC: 8.4 10*3/uL (ref 4.0–10.5)

## 2013-05-15 LAB — AMYLASE: Amylase: 80 U/L (ref 0–105)

## 2013-05-15 MED ORDER — PREDNISOLONE ACETATE 1 % OP SUSP: 1.0000 [drp] | Freq: Every day | OPHTHALMIC | Status: DC

## 2013-05-15 MED ORDER — ENALAPRIL MALEATE 2.5 MG PO TABS: 2.5000 mg | ORAL_TABLET | Freq: Every day | ORAL | Status: DC

## 2013-05-15 NOTE — Progress Notes (Signed)
Subjective; Patient has no complaints. She denies chest pain shortness of breath nausea vomiting or abdominal pain. According to her daughter she ate most of her lunch without difficulty. Objective; BP 144/71  Pulse 67  Temp(Src) 98.1 F (36.7 C) (Oral)  Resp 20  Ht 4\' 11"  (1.499 m)  Wt 108 lb (48.988 kg)  BMI 21.80 kg/m2  SpO2 100% Patient is alert and in no acute distress. Cardiac exam with occasional irregular. Normal S1 and S2. No murmur or gallop noted. Lungs are clear to auscultation. Abdomen symmetrical soft and nontender without organomegaly or masses. No LE edema noted.  Lab data; CBC    Component Value Date/Time   WBC 8.4 05/15/2013 0505   RBC 3.37* 05/15/2013 0505   HGB 11.8* 05/15/2013 0505   HCT 35.5* 05/15/2013 0505   PLT 241 05/15/2013 0505   MCV 105.3* 05/15/2013 0505   MCH 35.0* 05/15/2013 0505   MCHC 33.2 05/15/2013 0505   RDW 13.6 05/15/2013 0505   LYMPHSABS 0.7 04/12/2013 1950   MONOABS 0.4 04/12/2013 1950   EOSABS 0.2 04/12/2013 1950   BASOSABS 0.0 04/12/2013 1950   Amylase    Component Value Date/Time   AMYLASE 80 05/15/2013 0746   Comprehensive chemistry panel normal except albumin of 2.2.  Telemetry; patient is in normal sinus rhythm with occasional PVCs.  Assessment; Patient is doing very well following ERCP yesterday. Regarding cholelithiasis will monitor her clinical course. LFTs are normal except low albumin. Blood pressure is normal.  Plan; Patient will go home today. She will resume aspirin on 05/18/2013. New medication is enalapril 2.5 mg daily. Office visit in 8 weeks at which time and check LFTs.

## 2013-05-15 NOTE — Discharge Instructions (Signed)
Resume aspirin on 05/18/2013. Office visit in 2 months.

## 2013-05-16 NOTE — Discharge Summary (Signed)
Physician Discharge Summary  Erica ButteMary B Dunn EAV:[id number] DOB: 07/28/1920 DOA: 05/14/2013  PCP: Erasmo DownerStrader, Lindsey F, NP  Admit date: 05/14/2013 Discharge date: 05/15/2013.    Recommendations for Outpatient Follow-up:  1. Patient will return for followup visit in 8 weeks. 2. Resume aspirin on 05/19/2013.  Discharge Diagnoses:  Active Problems:   Cholelithiasis with choledocholithiasis   Miscellaneous diagnoses.    History of diastolic heart failure    Hypertension.    Hypothyroidism.    GERD.  Discharge Condition: Stable  Diet recommendation: Low salt heart healthy diet.  Filed Weights   05/14/13 1125  Weight: 108 lb (48.988 kg)    History of present illness:  Patient is 80 year old Caucasian female who initially presented with jaundice and cholangitis 2 months ago when she was acutely ill. She underwent ERCP and she had large stones in the bile duct and these could not be removed. Therefore stent was left in place. About 2 hours after the procedure she developed acute respiratory failure and responded to therapy without intubation. She improved and was discharged. She has done well and she now returns for repeat procedure to remove stent and residual stones.  Hospital Course:  Patient underwent ERCP on 05/14/2013 at which time a biliary stent was removed. Sphincterotomy was extended and dilated with a balloon and stone fragments were removed. She was noted to have multiple stones in the gallbladder and possibly some and cystic duct. He was decided to keep her in the hospital overnight because she had respiratory problems after her initial ERCP 2 months ago. She had no respiratory problems. Her blood pressure was elevated and she was begun on low-dose enalapril. She remained free of chest or abdominal pain and tolerated diet and was discharged in table condition. Patient previously had been seen by Dr. Lovell SheehanJenkins who recommended conservative therapy and we decided to monitor her clinical  course before considering cholecystectomy.  Procedures:  ERCP on 05/14/2013     Discharge Exam: Filed Vitals:   05/15/13 0610  BP: 144/71  Pulse: 67  Temp: 98.1 F (36.7 C)  Resp: 20     Discharge Instructions     Medication List    STOP taking these medications       aspirin 81 MG EC tablet     beta carotene w/minerals tablet     cyanocobalamin 1000 MCG/ML injection  Commonly known as:  (VITAMIN B-12)      TAKE these medications       acyclovir 800 MG tablet  Commonly known as:  ZOVIRAX  Take 800 mg by mouth 2 (two) times daily.     CALCIUM 600+D 600-400 MG-UNIT per tablet  Generic drug:  Calcium Carbonate-Vitamin D  Take 1 tablet by mouth 2 (two) times daily.     DUREZOL 0.05 % Emul  Generic drug:  Difluprednate  Place 1 drop into the left eye daily.     enalapril 2.5 MG tablet  Commonly known as:  VASOTEC  Take 1 tablet (2.5 mg total) by mouth daily.     furosemide 20 MG tablet  Commonly known as:  LASIX  Take 20 mg by mouth daily.     levothyroxine 50 MCG tablet  Commonly known as:  SYNTHROID, LEVOTHROID  Take 50 mcg by mouth daily before breakfast.     megestrol 40 MG/ML suspension  Commonly known as:  MEGACE  Take 400 mg by mouth 2 (two) times daily.     omeprazole 20 MG capsule  Commonly known as:  PRILOSEC  Take 20 mg by mouth 2 (two) times daily.     prednisoLONE acetate 1 % ophthalmic suspension  Commonly known as:  PRED FORTE  Place 1 drop into the left eye daily.     timolol 0.5 % ophthalmic solution  Commonly known as:  TIMOPTIC  Place 1 drop into the right eye daily.     Vitamin D (Ergocalciferol) 50000 UNITS Caps capsule  Commonly known as:  DRISDOL  Take 50,000 Units by mouth every 30 (thirty) days.       No Known Allergies    The results of significant diagnostics from this hospitalization (including imaging, microbiology, ancillary and laboratory) are listed below for reference.    Significant Diagnostic  Studies: Dg Ercp Biliary & Pancreatic Ducts  05/14/2013   CLINICAL DATA:  ERCP with sphincterotomy.  EXAM: ERCP with sphincterotomy  TECHNIQUE: Multiple spot images obtained with the fluoroscopic device and submitted for interpretation post-procedure.  COMPARISON:  03/16/2013  FINDINGS: Unchanged moderate dilatation of the biliary tree. Bubbles or mobile stones seen in the distal common bile duct on cine imaging. Basket and balloon sweeps performed, with free flow through the ampulla noted on the final image. Patent cystic duct with numerous faceted gallstones.  IMPRESSION: 1. Choledocholithiasis status post balloon sweep. 2. Patent cystic duct. 3. Cholelithiasis. These images were submitted for radiologic interpretation only. Please see the procedural report for the amount of contrast and the fluoroscopy time utilized.   Electronically Signed   By: Tiburcio Pea M.D.   On: 05/14/2013 14:03    Microbiology: No results found for this or any previous visit (from the past 240 hour(s)).   Labs: Basic Metabolic Panel:  Recent Labs Lab 05/14/13 0739 05/15/13 0505  NA 144 144  K 4.2 4.9  CL  --  106  CO2  --  31  GLUCOSE 81 79  BUN  --  11  CREATININE  --  1.05  CALCIUM  --  8.8   Liver Function Tests:  Recent Labs Lab 05/15/13 0505  AST 17  ALT <5  ALKPHOS 52  BILITOT 0.4  PROT 5.8*  ALBUMIN 2.2*    Recent Labs Lab 05/15/13 0746  AMYLASE 80   No results found for this basename: AMMONIA,  in the last 168 hours CBC:  Recent Labs Lab 05/14/13 0739 05/15/13 0505  WBC  --  8.4  HGB 12.6 11.8*  HCT 37.0 35.5*  MCV  --  105.3*  PLT  --  241   Cardiac Enzymes: No results found for this basename: CKTOTAL, CKMB, CKMBINDEX, TROPONINI,  in the last 168 hours BNP: BNP (last 3 results)  Recent Labs  03/16/13 0900  PROBNP 7350.0*      Signed:  Takoda Janowiak U  [facility] 05/16/2013, 4:48 PM

## 2013-05-21 ENCOUNTER — Encounter (INDEPENDENT_AMBULATORY_CARE_PROVIDER_SITE_OTHER): Payer: Self-pay | Admitting: *Deleted

## 2013-05-25 NOTE — Telephone Encounter (Signed)
error 

## 2013-06-02 ENCOUNTER — Other Ambulatory Visit (INDEPENDENT_AMBULATORY_CARE_PROVIDER_SITE_OTHER): Payer: Self-pay | Admitting: Internal Medicine

## 2013-06-02 MED ORDER — ENALAPRIL MALEATE 2.5 MG PO TABS: 2.5000 mg | ORAL_TABLET | Freq: Every day | ORAL | Status: DC

## 2013-07-13 ENCOUNTER — Encounter (INDEPENDENT_AMBULATORY_CARE_PROVIDER_SITE_OTHER): Payer: Self-pay | Admitting: Internal Medicine

## 2013-07-13 ENCOUNTER — Ambulatory Visit (INDEPENDENT_AMBULATORY_CARE_PROVIDER_SITE_OTHER): Payer: Medicare Other | Admitting: Internal Medicine

## 2013-07-13 VITALS — BP 98/64 | HR 56 | Temp 99.1°F | Resp 16 | Ht 59.0 in | Wt 105.3 lb

## 2013-07-13 DIAGNOSIS — E8809 Other disorders of plasma-protein metabolism, not elsewhere classified: Secondary | ICD-10-CM

## 2013-07-13 DIAGNOSIS — Z8719 Personal history of other diseases of the digestive system: Secondary | ICD-10-CM

## 2013-07-13 DIAGNOSIS — K8020 Calculus of gallbladder without cholecystitis without obstruction: Secondary | ICD-10-CM

## 2013-07-13 NOTE — Progress Notes (Signed)
Presenting complaint;  Followup for biliary tract disease.  Database;  Patient is 80 year old Caucasian female who presented with acute cholangitis in January this year and underwent ERCP with biliary stenting. She was also found to have cholelithiasis and seen in consultation by Dr. Lovell SheehanJenkins who felt she was high risk for surgery and recommended observation. Patient returned for elective ERCP two months ago with removal of stent along with removal of residual common duct stones. She now presents for followup visit.  Subjective:  Patient is accompanied by her daughter Eber JonesCarolyn. She has no complaints. Her appetite is improved since she was begun on Megace. She denies nausea vomiting abdominal pain fever or chills. She is able to ventilate inside the house without any difficulty. She states heartburn is well controlled with therapy. Her daughter states she did not do well with single-dose PPI. She denies shortness of breath or postural lightheadedness. Her bowels move daily. She denies melena or rectal bleeding. She has gained 12 pounds since 04/02/2013.   Current Medications: Outpatient Encounter Prescriptions as of 07/13/2013  Medication Sig  . acyclovir (ZOVIRAX) 800 MG tablet Take 800 mg by mouth 2 (two) times daily.  Marland Kitchen. aspirin 81 MG tablet Take 81 mg by mouth daily.  . Calcium Carbonate-Vitamin D (CALCIUM 600+D) 600-400 MG-UNIT per tablet Take 1 tablet by mouth 2 (two) times daily.  . Difluprednate (DUREZOL) 0.05 % EMUL Place 1 drop into the left eye daily.  Marland Kitchen. docusate sodium (COLACE) 100 MG capsule Take 100 mg by mouth 2 (two) times daily.  . enalapril (VASOTEC) 2.5 MG tablet Take 1 tablet (2.5 mg total) by mouth daily.  . furosemide (LASIX) 20 MG tablet Take 20 mg by mouth daily.  Marland Kitchen. levothyroxine (SYNTHROID, LEVOTHROID) 50 MCG tablet Take 50 mcg by mouth daily before breakfast.  . megestrol (MEGACE) 40 MG/ML suspension Take 400 mg by mouth 2 (two) times daily.  Marland Kitchen. omeprazole (PRILOSEC)  20 MG capsule Take 20 mg by mouth 2 (two) times daily.  . timolol (TIMOPTIC) 0.5 % ophthalmic solution Place 1 drop into the right eye daily.  . Vitamin D, Ergocalciferol, (DRISDOL) 50000 UNITS CAPS capsule Take 50,000 Units by mouth every 30 (thirty) days.  . [DISCONTINUED] prednisoLONE acetate (PRED FORTE) 1 % ophthalmic suspension Place 1 drop into the left eye daily.     Objective: Blood pressure 98/64, pulse 56, temperature 99.1 F (37.3 C), temperature source Oral, resp. rate 16, height 4\' 11"  (1.499 m), weight 105 lb 4.8 oz (47.764 kg). Patient is alert and in no acute distress. She was able to move from chair to examination table but assistance. Conjunctiva is pink. Sclera is nonicteric Oropharyngeal mucosa is normal. No neck masses or thyromegaly noted. Cardiac exam with occasional irregularity; normal S1 and S2. No murmur or gallop noted. Lungs are clear to auscultation. Abdomen is soft and nontender without organomegaly or masses.  No LE edema or clubbing noted.  Labs/studies Results: LFTs from 05/15/2013 were normal except albumin was 2   Assessment:  #1. History of cholangitis secondary to multiple bile duct stones. On initial presentation she was quite sick and treated with stenting and on  followup ERCP all of the stones were removed. She is presently free of symptoms. #2. Cholelithiasis. She appears to be asymptomatic and will be monitored. #3. Hypoalbuminemia. She does not have stigmata of chronic liver disease. While pre-albumin is unknown low albumin would appear to be due to malnutrition which seem to be worsening as she has gained 12 pounds in  the last 3-1/2 months.    Plan:  Patient will go to the left for LFTs. Patient advised to contact PCP if she develops postural lightheadedness. Office visit on as-needed basis.

## 2013-07-13 NOTE — Patient Instructions (Signed)
Physician will call with the results of blood tests. Notify if you have abdominal pain.

## 2013-07-14 LAB — HEPATIC FUNCTION PANEL
AST: 14 U/L (ref 0–37)
Albumin: 3.4 g/dL — ABNORMAL LOW (ref 3.5–5.2)
Alkaline Phosphatase: 48 U/L (ref 39–117)
BILIRUBIN DIRECT: 0.1 mg/dL (ref 0.0–0.3)
BILIRUBIN INDIRECT: 0.3 mg/dL (ref 0.2–1.2)
TOTAL PROTEIN: 6.3 g/dL (ref 6.0–8.3)
Total Bilirubin: 0.4 mg/dL (ref 0.2–1.2)

## 2013-08-09 ENCOUNTER — Emergency Department (HOSPITAL_COMMUNITY)
Admission: EM | Admit: 2013-08-09 | Discharge: 2013-08-09 | Disposition: A | Discharge status: Home / Self Care | Payer: Medicare Other | Attending: Emergency Medicine | Admitting: Emergency Medicine

## 2013-08-09 ENCOUNTER — Emergency Department (HOSPITAL_COMMUNITY): Payer: Medicare Other

## 2013-08-09 ENCOUNTER — Encounter (HOSPITAL_COMMUNITY): Payer: Self-pay | Admitting: Emergency Medicine

## 2013-08-09 DIAGNOSIS — Z7982 Long term (current) use of aspirin: Secondary | ICD-10-CM | POA: Insufficient documentation

## 2013-08-09 DIAGNOSIS — R109 Unspecified abdominal pain: Secondary | ICD-10-CM

## 2013-08-09 DIAGNOSIS — H409 Unspecified glaucoma: Secondary | ICD-10-CM | POA: Insufficient documentation

## 2013-08-09 DIAGNOSIS — M129 Arthropathy, unspecified: Secondary | ICD-10-CM | POA: Insufficient documentation

## 2013-08-09 DIAGNOSIS — R079 Chest pain, unspecified: Secondary | ICD-10-CM

## 2013-08-09 DIAGNOSIS — Z8619 Personal history of other infectious and parasitic diseases: Secondary | ICD-10-CM | POA: Insufficient documentation

## 2013-08-09 DIAGNOSIS — R0789 Other chest pain: Secondary | ICD-10-CM | POA: Insufficient documentation

## 2013-08-09 DIAGNOSIS — J189 Pneumonia, unspecified organism: Secondary | ICD-10-CM

## 2013-08-09 DIAGNOSIS — I10 Essential (primary) hypertension: Secondary | ICD-10-CM | POA: Insufficient documentation

## 2013-08-09 DIAGNOSIS — Z79899 Other long term (current) drug therapy: Secondary | ICD-10-CM | POA: Insufficient documentation

## 2013-08-09 DIAGNOSIS — E039 Hypothyroidism, unspecified: Secondary | ICD-10-CM | POA: Insufficient documentation

## 2013-08-09 DIAGNOSIS — K219 Gastro-esophageal reflux disease without esophagitis: Secondary | ICD-10-CM | POA: Insufficient documentation

## 2013-08-09 DIAGNOSIS — K8020 Calculus of gallbladder without cholecystitis without obstruction: Secondary | ICD-10-CM | POA: Insufficient documentation

## 2013-08-09 DIAGNOSIS — J159 Unspecified bacterial pneumonia: Secondary | ICD-10-CM | POA: Insufficient documentation

## 2013-08-09 LAB — CBC WITH DIFFERENTIAL/PLATELET
BASOS ABS: 0.1 10*3/uL (ref 0.0–0.1)
BASOS PCT: 0 % (ref 0–1)
EOS ABS: 0.3 10*3/uL (ref 0.0–0.7)
Eosinophils Relative: 2 % (ref 0–5)
HCT: 42.3 % (ref 36.0–46.0)
Hemoglobin: 14.2 g/dL (ref 12.0–15.0)
LYMPHS ABS: 3.9 10*3/uL (ref 0.7–4.0)
Lymphocytes Relative: 27 % (ref 12–46)
MCH: 36.5 pg — AB (ref 26.0–34.0)
MCHC: 33.6 g/dL (ref 30.0–36.0)
MCV: 108.7 fL — ABNORMAL HIGH (ref 78.0–100.0)
Monocytes Absolute: 1.6 10*3/uL — ABNORMAL HIGH (ref 0.1–1.0)
Monocytes Relative: 11 % (ref 3–12)
Neutro Abs: 8.7 10*3/uL — ABNORMAL HIGH (ref 1.7–7.7)
Neutrophils Relative %: 60 % (ref 43–77)
PLATELETS: 284 10*3/uL (ref 150–400)
RBC: 3.89 MIL/uL (ref 3.87–5.11)
RDW: 14.9 % (ref 11.5–15.5)
WBC: 14.5 10*3/uL — ABNORMAL HIGH (ref 4.0–10.5)

## 2013-08-09 LAB — COMPREHENSIVE METABOLIC PANEL
ALBUMIN: 3 g/dL — AB (ref 3.5–5.2)
ALT: 9 U/L (ref 0–35)
AST: 18 U/L (ref 0–37)
Alkaline Phosphatase: 63 U/L (ref 39–117)
BUN: 22 mg/dL (ref 6–23)
CALCIUM: 9.2 mg/dL (ref 8.4–10.5)
CO2: 25 mEq/L (ref 19–32)
Chloride: 105 mEq/L (ref 96–112)
Creatinine, Ser: 1.29 mg/dL — ABNORMAL HIGH (ref 0.50–1.10)
GFR calc non Af Amer: 35 mL/min — ABNORMAL LOW (ref >90)
GFR, EST AFRICAN AMERICAN: 41 mL/min — AB (ref >90)
GLUCOSE: 138 mg/dL — AB (ref 70–99)
Potassium: 3.6 mEq/L — ABNORMAL LOW (ref 3.7–5.3)
SODIUM: 145 meq/L (ref 137–147)
Total Bilirubin: 0.3 mg/dL (ref 0.3–1.2)
Total Protein: 7 g/dL (ref 6.0–8.3)

## 2013-08-09 LAB — TROPONIN I: Troponin I: <0.3 ng/mL (ref <0.30)

## 2013-08-09 LAB — PRO B NATRIURETIC PEPTIDE: Pro B Natriuretic peptide (BNP): 1097 pg/mL — ABNORMAL HIGH (ref 0–450)

## 2013-08-09 LAB — LIPASE, BLOOD: Lipase: 48 U/L (ref 11–59)

## 2013-08-09 MED ORDER — AZITHROMYCIN 250 MG PO TABS
500.0000 mg | ORAL_TABLET | Freq: Once | ORAL | Status: AC
  Administered 2013-08-09: 500 mg via ORAL
  Filled 2013-08-09: qty 2

## 2013-08-09 MED ORDER — IOHEXOL 350 MG/ML SOLN
80.0000 mL | Freq: Once | INTRAVENOUS | Status: AC | PRN
  Administered 2013-08-09: 80 mL via INTRAVENOUS

## 2013-08-09 MED ORDER — HYDROCODONE-ACETAMINOPHEN 5-325 MG PO TABS: 2.0000 | ORAL_TABLET | ORAL | Status: AC | PRN

## 2013-08-09 MED ORDER — MORPHINE SULFATE 2 MG/ML IJ SOLN
2.0000 mg | INTRAMUSCULAR | Status: DC | PRN
  Administered 2013-08-09: 2 mg via INTRAVENOUS
  Filled 2013-08-09 (×2): qty 1

## 2013-08-09 MED ORDER — AZITHROMYCIN 250 MG PO TABS: 250.0000 mg | ORAL_TABLET | Freq: Every day | ORAL | Status: DC

## 2013-08-09 NOTE — ED Notes (Signed)
Patient c/o left side chest pain that started an hour ago. Patient diaphoretic. Reports shortness of breath, nausea, and dizziness.

## 2013-08-09 NOTE — Discharge Instructions (Signed)
Zithromax as prescribed. Hydrocodone as prescribed for pain.  Return to the emergency department for high fever, worsening pain, difficulty breathing, or other new and concerning symptoms to   Chest Pain (Nonspecific) It is often hard to give a specific diagnosis for the cause of chest pain. There is always a chance that your pain could be related to something serious, such as a heart attack or a blood clot in the lungs. You need to follow up with your caregiver for further evaluation. CAUSES   Heartburn.  Pneumonia or bronchitis.  Anxiety or stress.  Inflammation around your heart (pericarditis) or lung (pleuritis or pleurisy).  A blood clot in the lung.  A collapsed lung (pneumothorax). It can develop suddenly on its own (spontaneous pneumothorax) or from injury (trauma) to the chest.  Shingles infection (herpes zoster virus). The chest wall is composed of bones, muscles, and cartilage. Any of these can be the source of the pain.  The bones can be bruised by injury.  The muscles or cartilage can be strained by coughing or overwork.  The cartilage can be affected by inflammation and become sore (costochondritis). DIAGNOSIS  Lab tests or other studies, such as X-rays, electrocardiography, stress testing, or cardiac imaging, may be needed to find the cause of your pain.  TREATMENT   Treatment depends on what may be causing your chest pain. Treatment may include:  Acid blockers for heartburn.  Anti-inflammatory medicine.  Pain medicine for inflammatory conditions.  Antibiotics if an infection is present.  You may be advised to change lifestyle habits. This includes stopping smoking and avoiding alcohol, caffeine, and chocolate.  You may be advised to keep your head raised (elevated) when sleeping. This reduces the chance of acid going backward from your stomach into your esophagus.  Most of the time, nonspecific chest pain will improve within 2 to 3 days with rest and  mild pain medicine. HOME CARE INSTRUCTIONS   If antibiotics were prescribed, take your antibiotics as directed. Finish them even if you start to feel better.  For the next few days, avoid physical activities that bring on chest pain. Continue physical activities as directed.  Do not smoke.  Avoid drinking alcohol.  Only take over-the-counter or prescription medicine for pain, discomfort, or fever as directed by your caregiver.  Follow your caregiver's suggestions for further testing if your chest pain does not go away.  Keep any follow-up appointments you made. If you do not go to an appointment, you could develop lasting (chronic) problems with pain. If there is any problem keeping an appointment, you must call to reschedule. SEEK MEDICAL CARE IF:   You think you are having problems from the medicine you are taking. Read your medicine instructions carefully.  Your chest pain does not go away, even after treatment.  You develop a rash with blisters on your chest. SEEK IMMEDIATE MEDICAL CARE IF:   You have increased chest pain or pain that spreads to your arm, neck, jaw, back, or abdomen.  You develop shortness of breath, an increasing cough, or you are coughing up blood.  You have severe back or abdominal pain, feel nauseous, or vomit.  You develop severe weakness, fainting, or chills.  You have a fever. THIS IS AN EMERGENCY. Do not wait to see if the pain will go away. Get medical help at once. Call your local emergency services (911 in [place] Do not drive yourself to the hospital. MAKE SURE YOU:   Understand these instructions.  Will watch your  condition.  Will get help right away if you are not doing well or get worse. Document Released: 11/22/2004 Document Revised: 05/07/2011 Document Reviewed: 09/18/2007 [facility] Patient Information 2014 [place].

## 2013-08-09 NOTE — ED Notes (Signed)
Appears anxious and restless.

## 2013-08-09 NOTE — ED Provider Notes (Addendum)
CSN: [id number]     Arrival date & time 08/09/13  1823 History  This chart was scribed for Geoffery Lyonsouglas Luisfernando Brightwell, MD by Leone PayorSonum Patel, ED Scribe. This patient was seen in room APA12/APA12 and the patient's care was started 6:40 PM.    Chief Complaint  Patient presents with  . Chest Pain      The history is provided by the patient and a relative. The history is limited by the condition of the patient. No language interpreter was used.   LEVEL 5 CAVEAT HPI Comments: Erica Dunn is a 80 y.o. female who presents to the Emergency Department complaining of constant left sided chest pain with associated SOB and diaphoresis that began about 1 hour ago. Daughter denies any falls or injuries today. Daughters deny recent fever, cough. Patient has a history of gallstones and bilary stent placement. She denies emphysema or COPD.   Past Medical History  Diagnosis Date  . GERD (gastroesophageal reflux disease)   . Essential hypertension, benign   . Aortic atherosclerosis     Documented by CT imaging  . Gallstones   . HOH (hard of hearing)   . Complication of anesthesia   . PONV (postoperative nausea and vomiting)   . Herpes ocular   . Arthritis   . Glaucoma   . Hypothyroidism    Past Surgical History  Procedure Laterality Date  . Breast surgery Left     60 years ago  . Abdominal hysterectomy    . Ercp  03/16/2013    Procedure: ENDOSCOPIC RETROGRADE CHOLANGIOPANCREATOGRAPHY (ERCP) (Duct not cleared of all stones);  Surgeon: Malissa HippoNajeeb U Rehman, MD;  Location: [facility];  Service: Endoscopy;;  . Sphincterotomy  03/16/2013    Procedure: Dennison MascotSPHINCTEROTOMY;  Surgeon: Malissa HippoNajeeb U Rehman, MD;  Location: [facility];  Service: Endoscopy;;  . Balloon dilation  03/16/2013    Procedure: Marvis RepressBALLOON DILATION;  Surgeon: Malissa HippoNajeeb U Rehman, MD;  Location: [facility];  Service: Endoscopy;;  . Removal of stones  03/16/2013    Procedure: REMOVAL OF STONES WITH STONE BASKET;  Surgeon: Malissa HippoNajeeb U Rehman, MD;  Location: [facility];  Service: Endoscopy;;   . Biliary stent placement  03/16/2013    Procedure: BILIARY STENT PLACEMENT;  Surgeon: Malissa HippoNajeeb U Rehman, MD;  Location: [facility];  Service: Endoscopy;;  . Ercp N/A 05/14/2013    Procedure: ENDOSCOPIC RETROGRADE CHOLANGIOPANCREATOGRAPHY (ERCP);  Surgeon: Malissa HippoNajeeb U Rehman, MD;  Location: [facility];  Service: Endoscopy;  Laterality: N/A;  730  . Removal of stones N/A 05/14/2013    Procedure: REMOVAL OF STONES;  Surgeon: Malissa HippoNajeeb U Rehman, MD;  Location: [facility];  Service: Endoscopy;  Laterality: N/A;  . Stent removal N/A 05/14/2013    Procedure: STENT REMOVAL;  Surgeon: Malissa HippoNajeeb U Rehman, MD;  Location: [facility];  Service: Endoscopy;  Laterality: N/A;  . Sphincterotomy  05/14/2013    Procedure:  EXTENSION OF SPHINCTEROTOMY;  Surgeon: Malissa HippoNajeeb U Rehman, MD;  Location: [facility];  Service: Endoscopy;;   History reviewed. No pertinent family history. History  Substance Use Topics  . Smoking status: Never Smoker   . Smokeless tobacco: Current User    Types: Snuff  . Alcohol Use: No   OB History   Grav Para Term Preterm Abortions TAB SAB Ect Mult Living                 Review of Systems  Unable to perform ROS: Acuity of condition      Allergies  Review of patient's allergies indicates no known  allergies.  Home Medications   Prior to Admission medications   Medication Sig Start Date End Date Taking? Authorizing Provider  acyclovir (ZOVIRAX) 800 MG tablet Take 800 mg by mouth 2 (two) times daily.    Historical Provider, MD  aspirin 81 MG tablet Take 81 mg by mouth daily.    Historical Provider, MD  Calcium Carbonate-Vitamin D (CALCIUM 600+D) 600-400 MG-UNIT per tablet Take 1 tablet by mouth 2 (two) times daily.    Historical Provider, MD  Difluprednate (DUREZOL) 0.05 % EMUL Place 1 drop into the left eye daily.    Historical Provider, MD  docusate sodium (COLACE) 100 MG capsule Take 100 mg by mouth 2 (two) times daily.    Historical Provider, MD  enalapril (VASOTEC) 2.5 MG tablet Take 1 tablet (2.5 mg  total) by mouth daily. 06/02/13   Len Blalockerri L Setzer, NP  furosemide (LASIX) 20 MG tablet Take 20 mg by mouth daily.    Historical Provider, MD  levothyroxine (SYNTHROID, LEVOTHROID) 50 MCG tablet Take 50 mcg by mouth daily before breakfast.    Historical Provider, MD  megestrol (MEGACE) 40 MG/ML suspension Take 400 mg by mouth 2 (two) times daily.    Historical Provider, MD  omeprazole (PRILOSEC) 20 MG capsule Take 20 mg by mouth 2 (two) times daily.    Historical Provider, MD  timolol (TIMOPTIC) 0.5 % ophthalmic solution Place 1 drop into the right eye daily.    Historical Provider, MD  Vitamin D, Ergocalciferol, (DRISDOL) 50000 UNITS CAPS capsule Take 50,000 Units by mouth every 30 (thirty) days.    Historical Provider, MD   BP 192/80  Pulse 73  Temp(Src) 97.9 F (36.6 C) (Oral)  Resp 22  Ht 4\' 8"  (1.422 m)  Wt 105 lb (47.628 kg)  BMI 23.55 kg/m2  SpO2 99% Physical Exam  Nursing note and vitals reviewed. Constitutional: She is oriented to person, place, and time.  Patient is an elderly female who appears acutely ill. She is diaphoretic and pale.   HENT:  Head: Normocephalic and atraumatic.  Neck: Normal range of motion. Neck supple.  Cardiovascular: Normal rate, regular rhythm and normal heart sounds.   Pulmonary/Chest: Effort normal and breath sounds normal. No respiratory distress. She has no wheezes. She has no rales.  Abdominal: Soft. She exhibits no distension. There is no tenderness. There is no rebound and no guarding.  Musculoskeletal: Normal range of motion.  Neurological: She is alert and oriented to person, place, and time. She exhibits normal muscle tone. Coordination normal.  Skin: Skin is warm and dry.  Psychiatric: She has a normal mood and affect.    ED Course  Procedures (including critical care time)  DIAGNOSTIC STUDIES: Oxygen Saturation is 99% on RA, normal by my interpretation.    COORDINATION OF CARE: 6:47 PM Discussed treatment plan with daughters at  bedside and they agreed to plan.   Labs Review Labs Reviewed - No data to display  Imaging Review No results found.   EKG Interpretation   Date/Time:  Sunday August 09 2013 18:36:29 EDT Ventricular Rate:  73 PR Interval:  144 QRS Duration: 74 QT Interval:  394 QTC Calculation: 434 R Axis:   -10 Text Interpretation:  Sinus rhythm Atrial premature complex Low voltage,  precordial leads No significant change since last tracing Confirmed by  DELOS  MD, Rahim Astorga ([id number]) on 08/09/2013 6:39:03 PM      MDM   Final diagnoses:  None    Patient is a 80 year old female who presents  with sudden onset of severe left chest pain that started one hour ago. She was initially diaphoretic and appeared acutely ill. I initiated a workup including laboratory studies, chest x-ray and chest and abdomen CT scans to rule out significant pathology. These were performed and reveal a mild white count along with streaking in the right lung which is suggestive of pneumonia. This does not fit the clinical picture, however she will be treated with Zithromax. She was given 2 mg of morphine and her symptoms completely resolved. She now feels fine and is requesting to go home. I will allow her to be discharged and she understands to return if her symptoms substantially worsen or change.  I strongly doubt a cardiac etiology as there were no ekg changes during this episode.  Her daughter tells me she has a stent in her bile duct that intermittently causes her problems, but not usually this severe.  Due to an absence of other pathology, I am under the assumption biliary colic is the likely cause.  Either way, she is now symptom-free, workup shows nothing acute, and she is requesting to go home.  I feel as though this is appropriate.  I personally performed the services described in this documentation, which was scribed in my presence. The recorded information has been reviewed and is accurate.      Geoffery Lyons,  MD 08/09/13 1610  Geoffery Lyons, MD 08/10/13 [phone]

## 2013-08-18 ENCOUNTER — Ambulatory Visit (INDEPENDENT_AMBULATORY_CARE_PROVIDER_SITE_OTHER): Payer: Medicare Other | Admitting: Internal Medicine

## 2013-08-18 ENCOUNTER — Encounter (INDEPENDENT_AMBULATORY_CARE_PROVIDER_SITE_OTHER): Payer: Self-pay | Admitting: Internal Medicine

## 2013-08-18 VITALS — BP 118/84 | HR 112 | Temp 98.2°F | Ht <= 58 in | Wt 111.4 lb

## 2013-08-18 DIAGNOSIS — R079 Chest pain, unspecified: Secondary | ICD-10-CM

## 2013-08-18 NOTE — Progress Notes (Signed)
Subjective:     Patient ID: Erica Dunn, female   DOB: 07/14/1920, 80 y.o.   MRN: [id number]  HPI Here today for f/u after recent visit to the ED (08/09/2013)  For left sided chest pain. Cardiac work up was negative. She was diaphoretic and had chest pain.  Family states that since ED, mother has been okay. She has not had any abdominal pain. No jaundice. Appetite is good. No abdominal pain.  BMs are normal. No melena or bright red rectal bleeding.  Liver enzymes in the ED 08/09/2013) were normal. In January underwent an ERCP for CBD stones. See below.  CMP     Component Value Date/Time   NA 145 08/09/2013 1842   K 3.6* 08/09/2013 1842   CL 105 08/09/2013 1842   CO2 25 08/09/2013 1842   GLUCOSE 138* 08/09/2013 1842   BUN 22 08/09/2013 1842   CREATININE 1.29* 08/09/2013 1842   CREATININE 1.25* 04/02/2013 1506   CALCIUM 9.2 08/09/2013 1842   PROT 7.0 08/09/2013 1842   ALBUMIN 3.0* 08/09/2013 1842   AST 18 08/09/2013 1842   ALT 9 08/09/2013 1842   ALKPHOS 63 08/09/2013 1842   BILITOT 0.3 08/09/2013 1842   GFRNONAA 35* 08/09/2013 1842   GFRAA 41* 08/09/2013 1842       05/14/2013 ERCP:Procedure: ERCP with stent, extension of sphincterotomy and stone removal.   Impression:  Food debris in the stomach with patent pylorus.  Multiple duodenal diverticula.  Biliary stent removed as above.  Dilated biliary system with two small stones which were removed.  Long cystic duct with low takeoff and multiple gallstones.    02/2013 ERCP:Procedure: ERCP with sphincterotomy and biliary stenting.  Indications: Patient is 80 year old Caucasian female who presents with abdominal pain and low-grade fever and jaundice. CT reveals dilated biliary system with 2 large stones as well as distended gallbladder with stones.  Impression:  Dilated biliary system with two large calcified stones.  Stone fragments removed using mechanical lithotripter but not be cleared of all pieces despite balloon dilation of ampulla.  10  French 9 cm long plastic stent laser biliary decompression.    Review of Systems Past Medical History  Diagnosis Date  . GERD (gastroesophageal reflux disease)   . Essential hypertension, benign   . Aortic atherosclerosis     Documented by CT imaging  . Gallstones   . HOH (hard of hearing)   . Complication of anesthesia   . PONV (postoperative nausea and vomiting)   . Herpes ocular   . Arthritis   . Glaucoma   . Hypothyroidism     Past Surgical History  Procedure Laterality Date  . Breast surgery Left     60 years ago  . Abdominal hysterectomy    . Ercp  03/16/2013    Procedure: ENDOSCOPIC RETROGRADE CHOLANGIOPANCREATOGRAPHY (ERCP) (Duct not cleared of all stones);  Surgeon: Malissa HippoNajeeb U Rehman, MD;  Location: [facility];  Service: Endoscopy;;  . Sphincterotomy  03/16/2013    Procedure: Dennison MascotSPHINCTEROTOMY;  Surgeon: Malissa HippoNajeeb U Rehman, MD;  Location: [facility];  Service: Endoscopy;;  . Balloon dilation  03/16/2013    Procedure: Marvis RepressBALLOON DILATION;  Surgeon: Malissa HippoNajeeb U Rehman, MD;  Location: [facility];  Service: Endoscopy;;  . Removal of stones  03/16/2013    Procedure: REMOVAL OF STONES WITH STONE BASKET;  Surgeon: Malissa HippoNajeeb U Rehman, MD;  Location: [facility];  Service: Endoscopy;;  . Biliary stent placement  03/16/2013    Procedure: BILIARY STENT PLACEMENT;  Surgeon: Malissa HippoNajeeb U Rehman,  MD;  Location: [facility];  Service: Endoscopy;;  . Ercp N/A 05/14/2013    Procedure: ENDOSCOPIC RETROGRADE CHOLANGIOPANCREATOGRAPHY (ERCP);  Surgeon: Malissa HippoNajeeb U Rehman, MD;  Location: [facility];  Service: Endoscopy;  Laterality: N/A;  730  . Removal of stones N/A 05/14/2013    Procedure: REMOVAL OF STONES;  Surgeon: Malissa HippoNajeeb U Rehman, MD;  Location: [facility];  Service: Endoscopy;  Laterality: N/A;  . Stent removal N/A 05/14/2013    Procedure: STENT REMOVAL;  Surgeon: Malissa HippoNajeeb U Rehman, MD;  Location: [facility];  Service: Endoscopy;  Laterality: N/A;  . Sphincterotomy  05/14/2013    Procedure:  EXTENSION OF SPHINCTEROTOMY;  Surgeon: Malissa HippoNajeeb U Rehman,  MD;  Location: [facility];  Service: Endoscopy;;    No Known Allergies  Current Outpatient Prescriptions on File Prior to Visit  Medication Sig Dispense Refill  . acyclovir (ZOVIRAX) 800 MG tablet Take 800 mg by mouth 2 (two) times daily.      Marland Kitchen. aspirin EC 81 MG tablet Take 81 mg by mouth every morning.      . Calcium Carbonate-Vitamin D (CALCIUM 600+D) 600-400 MG-UNIT per tablet Take 1 tablet by mouth 2 (two) times daily.      . Difluprednate (DUREZOL) 0.05 % EMUL Place 1 drop into the left eye every morning.       . docusate sodium (COLACE) 100 MG capsule Take 100 mg by mouth 2 (two) times daily.      . enalapril (VASOTEC) 2.5 MG tablet Take 2.5 mg by mouth every morning.      . furosemide (LASIX) 20 MG tablet Take 20 mg by mouth every morning.       Marland Kitchen. HYDROcodone-acetaminophen (NORCO) 5-325 MG per tablet Take 2 tablets by mouth every 4 (four) hours as needed.  20 tablet  0  . levothyroxine (SYNTHROID, LEVOTHROID) 50 MCG tablet Take 50 mcg by mouth daily before breakfast.      . megestrol (MEGACE) 40 MG/ML suspension Take by mouth daily.       Marland Kitchen. omeprazole (PRILOSEC) 20 MG capsule Take 20 mg by mouth 2 (two) times daily.      . timolol (TIMOPTIC) 0.5 % ophthalmic solution Place 1 drop into the right eye every morning.       . Vitamin D, Ergocalciferol, (DRISDOL) 50000 UNITS CAPS capsule Take 50,000 Units by mouth every 30 (thirty) days.       No current facility-administered medications on file prior to visit.        Objective:   Physical Exam  Filed Vitals:   08/18/13 0942  BP: 118/84  Pulse: 112  Temp: 98.2 F (36.8 C)  Height: 4\' 8"  (1.422 m)  Weight: 111 lb 6.4 oz (50.531 kg)  Alert and oriented. Skin warm and dry. Oral mucosa is moist.   . Sclera anicteric, conjunctivae is pink. Thyroid not enlarged. No cervical lymphadenopathy. Lungs clear. Heart regular rate and rhythm.  Abdomen is soft. Bowel sounds are positive. No hepatomegaly. No abdominal masses felt. No tenderness.  No  edema to lower extremities.        Assessment:    Chest pain ? Etiology. Doubt GB related. CMet was normal. No further episodes of chest pain since ED visit.     Plan:    If chest reoccurs, go to the ED. OV in 6 months.

## 2013-08-18 NOTE — Patient Instructions (Signed)
OV in 6 months. 

## 2013-11-26 ENCOUNTER — Encounter (INDEPENDENT_AMBULATORY_CARE_PROVIDER_SITE_OTHER): Payer: Self-pay | Admitting: *Deleted

## 2014-01-20 ENCOUNTER — Emergency Department (HOSPITAL_COMMUNITY)
Admission: EM | Admit: 2014-01-20 | Discharge: 2014-01-20 | Disposition: A | Discharge status: Home / Self Care | Payer: Medicare Other | Attending: Emergency Medicine | Admitting: Emergency Medicine

## 2014-01-20 ENCOUNTER — Encounter (HOSPITAL_COMMUNITY): Payer: Self-pay | Admitting: *Deleted

## 2014-01-20 ENCOUNTER — Emergency Department (HOSPITAL_COMMUNITY): Payer: Medicare Other

## 2014-01-20 DIAGNOSIS — I10 Essential (primary) hypertension: Secondary | ICD-10-CM | POA: Insufficient documentation

## 2014-01-20 DIAGNOSIS — R000 Tachycardia, unspecified: Secondary | ICD-10-CM | POA: Diagnosis not present

## 2014-01-20 DIAGNOSIS — K219 Gastro-esophageal reflux disease without esophagitis: Secondary | ICD-10-CM | POA: Insufficient documentation

## 2014-01-20 DIAGNOSIS — R0602 Shortness of breath: Secondary | ICD-10-CM | POA: Insufficient documentation

## 2014-01-20 DIAGNOSIS — E039 Hypothyroidism, unspecified: Secondary | ICD-10-CM | POA: Insufficient documentation

## 2014-01-20 DIAGNOSIS — R531 Weakness: Secondary | ICD-10-CM | POA: Insufficient documentation

## 2014-01-20 DIAGNOSIS — E860 Dehydration: Secondary | ICD-10-CM | POA: Diagnosis not present

## 2014-01-20 DIAGNOSIS — Z7982 Long term (current) use of aspirin: Secondary | ICD-10-CM | POA: Diagnosis not present

## 2014-01-20 DIAGNOSIS — Z79899 Other long term (current) drug therapy: Secondary | ICD-10-CM | POA: Diagnosis not present

## 2014-01-20 DIAGNOSIS — Z8619 Personal history of other infectious and parasitic diseases: Secondary | ICD-10-CM | POA: Diagnosis not present

## 2014-01-20 LAB — BLOOD GAS, ARTERIAL
ACID-BASE DEFICIT: 0.6 mmol/L (ref 0.0–2.0)
Bicarbonate: 23 mEq/L (ref 20.0–24.0)
DRAWN BY: 21310
O2 Saturation: 96.9 %
PO2 ART: 90.9 mmHg (ref 80.0–100.0)
Patient temperature: 37
TCO2: 19.7 mmol/L (ref 0–100)
pCO2 arterial: 34.3 mmHg — ABNORMAL LOW (ref 35.0–45.0)
pH, Arterial: 7.441 (ref 7.350–7.450)

## 2014-01-20 LAB — CBC WITH DIFFERENTIAL/PLATELET
BASOS PCT: 0 % (ref 0–1)
Basophils Absolute: 0 10*3/uL (ref 0.0–0.1)
EOS PCT: 1 % (ref 0–5)
Eosinophils Absolute: 0.1 10*3/uL (ref 0.0–0.7)
HEMATOCRIT: 44 % (ref 36.0–46.0)
HEMOGLOBIN: 14.5 g/dL (ref 12.0–15.0)
LYMPHS ABS: 2.6 10*3/uL (ref 0.7–4.0)
Lymphocytes Relative: 20 % (ref 12–46)
MCH: 36.6 pg — AB (ref 26.0–34.0)
MCHC: 33 g/dL (ref 30.0–36.0)
MCV: 111.1 fL — ABNORMAL HIGH (ref 78.0–100.0)
Monocytes Absolute: 1.4 10*3/uL — ABNORMAL HIGH (ref 0.1–1.0)
Monocytes Relative: 11 % (ref 3–12)
NEUTROS ABS: 9 10*3/uL — AB (ref 1.7–7.7)
Neutrophils Relative %: 68 % (ref 43–77)
Platelets: 254 10*3/uL (ref 150–400)
RBC: 3.96 MIL/uL (ref 3.87–5.11)
RDW: 14 % (ref 11.5–15.5)
WBC: 13.1 10*3/uL — ABNORMAL HIGH (ref 4.0–10.5)

## 2014-01-20 LAB — TROPONIN I

## 2014-01-20 LAB — COMPREHENSIVE METABOLIC PANEL
ALT: 12 U/L (ref 0–35)
AST: 20 U/L (ref 0–37)
Albumin: 3.2 g/dL — ABNORMAL LOW (ref 3.5–5.2)
Alkaline Phosphatase: 57 U/L (ref 39–117)
Anion gap: 14 (ref 5–15)
BUN: 29 mg/dL — ABNORMAL HIGH (ref 6–23)
CALCIUM: 9.9 mg/dL (ref 8.4–10.5)
CO2: 25 mEq/L (ref 19–32)
CREATININE: 1.64 mg/dL — AB (ref 0.50–1.10)
Chloride: 103 mEq/L (ref 96–112)
GFR calc Af Amer: 30 mL/min — ABNORMAL LOW (ref >90)
GFR calc non Af Amer: 26 mL/min — ABNORMAL LOW (ref >90)
GLUCOSE: 159 mg/dL — AB (ref 70–99)
Potassium: 3.9 mEq/L (ref 3.7–5.3)
SODIUM: 142 meq/L (ref 137–147)
TOTAL PROTEIN: 7.2 g/dL (ref 6.0–8.3)
Total Bilirubin: 0.3 mg/dL (ref 0.3–1.2)

## 2014-01-20 LAB — URINALYSIS, ROUTINE W REFLEX MICROSCOPIC
Bilirubin Urine: NEGATIVE
GLUCOSE, UA: NEGATIVE mg/dL
KETONES UR: NEGATIVE mg/dL
Leukocytes, UA: NEGATIVE
Nitrite: NEGATIVE
Protein, ur: NEGATIVE mg/dL
Specific Gravity, Urine: 1.02 (ref 1.005–1.030)
Urobilinogen, UA: 0.2 mg/dL (ref 0.0–1.0)
pH: 5 (ref 5.0–8.0)

## 2014-01-20 LAB — URINE MICROSCOPIC-ADD ON

## 2014-01-20 LAB — I-STAT CG4 LACTIC ACID, ED
LACTIC ACID, VENOUS: 3.2 mmol/L — AB (ref 0.5–2.2)
Lactic Acid, Venous: 2.11 mmol/L (ref 0.5–2.2)

## 2014-01-20 LAB — PRO B NATRIURETIC PEPTIDE: PRO B NATRI PEPTIDE: 368.1 pg/mL (ref 0–450)

## 2014-01-20 MED ORDER — SODIUM CHLORIDE 0.9 % IV BOLUS (SEPSIS)
500.0000 mL | Freq: Once | INTRAVENOUS | Status: AC
  Administered 2014-01-20: 500 mL via INTRAVENOUS

## 2014-01-20 NOTE — ED Notes (Signed)
Discharge instructions given and reviewed with patient and family members.  Patient verbalized understanding to increase fluids and to follow up with PMD as needed.  Patient discharged home in good condition via wheelchair.

## 2014-01-20 NOTE — Discharge Instructions (Signed)
Stay hydrated. Return to the emergency room with any worsening symptoms.  Dehydration, Adult Dehydration is when you lose more fluids from the body than you take in. Vital organs like the kidneys, brain, and heart cannot function without a proper amount of fluids and salt. Any loss of fluids from the body can cause dehydration.  CAUSES   Vomiting.  Diarrhea.  Excessive sweating.  Excessive urine output.  Fever. SYMPTOMS  Mild dehydration  Thirst.  Dry lips.  Slightly dry mouth. Moderate dehydration  Very dry mouth.  Sunken eyes.  Skin does not bounce back quickly when lightly pinched and released.  Dark urine and decreased urine production.  Decreased tear production.  Headache. Severe dehydration  Very dry mouth.  Extreme thirst.  Rapid, weak pulse (more than 100 beats per minute at rest).  Cold hands and feet.  Not able to sweat in spite of heat and temperature.  Rapid breathing.  Blue lips.  Confusion and lethargy.  Difficulty being awakened.  Minimal urine production.  No tears. DIAGNOSIS  Your caregiver will diagnose dehydration based on your symptoms and your exam. Blood and urine tests will help confirm the diagnosis. The diagnostic evaluation should also identify the cause of dehydration. TREATMENT  Treatment of mild or moderate dehydration can often be done at home by increasing the amount of fluids that you drink. It is best to drink small amounts of fluid more often. Drinking too much at one time can make vomiting worse. Refer to the home care instructions below. Severe dehydration needs to be treated at the hospital where you will probably be given intravenous (IV) fluids that contain water and electrolytes. HOME CARE INSTRUCTIONS   Ask your caregiver about specific rehydration instructions.  Drink enough fluids to keep your urine clear or pale yellow.  Drink small amounts frequently if you have nausea and vomiting.  Eat as you  normally do.  Avoid:  Foods or drinks high in sugar.  Carbonated drinks.  Juice.  Extremely hot or cold fluids.  Drinks with caffeine.  Fatty, greasy foods.  Alcohol.  Tobacco.  Overeating.  Gelatin desserts.  Wash your hands well to avoid spreading bacteria and viruses.  Only take over-the-counter or prescription medicines for pain, discomfort, or fever as directed by your caregiver.  Ask your caregiver if you should continue all prescribed and over-the-counter medicines.  Keep all follow-up appointments with your caregiver. SEEK MEDICAL CARE IF:  You have abdominal pain and it increases or stays in one area (localizes).  You have a rash, stiff neck, or severe headache.  You are irritable, sleepy, or difficult to awaken.  You are weak, dizzy, or extremely thirsty. SEEK IMMEDIATE MEDICAL CARE IF:   You are unable to keep fluids down or you get worse despite treatment.  You have frequent episodes of vomiting or diarrhea.  You have blood or green matter (bile) in your vomit.  You have blood in your stool or your stool looks black and tarry.  You have not urinated in 6 to 8 hours, or you have only urinated a small amount of very dark urine.  You have a fever.  You faint. MAKE SURE YOU:   Understand these instructions.  Will watch your condition.  Will get help right away if you are not doing well or get worse. Document Released: 02/12/2005 Document Revised: 05/07/2011 Document Reviewed: 10/02/2010 [facility] Patient Information 2015 [employer] [place]. This information is not intended to replace advice given to you by your health care provider.  Make sure you discuss any questions you have with your health care provider. ° °

## 2014-01-20 NOTE — ED Notes (Signed)
Pt has had increasing SOB x1 week, denies chest pain, sats 98% per EMS.

## 2014-01-20 NOTE — ED Provider Notes (Signed)
CSN: [id number]     Arrival date & time 01/20/14  1708 History   First MD Initiated Contact with Patient 01/20/14 1722     Chief Complaint  Patient presents with  . Shortness of Breath      HPI  Patient presents for valuation of shortness of breath. Talk with her she states it is really more of a generalized weakness patient states she just doesn't have a lot of energy. She states she is really not been eating and drinking much. No pain no nausea no vomiting no diarrhea no reason to be volume depleted. Comply with the medications she does take a daily Lasix dose. Symmetric for a urinary tract infection and has completed her antibiotics has no additional symptoms.  No cough or sputum production. No shortness of breath or chest pain no falls or injuries. Accompanied by multiple family members she lives with or close by Past Medical History  Diagnosis Date  . GERD (gastroesophageal reflux disease)   . Essential hypertension, benign   . Aortic atherosclerosis     Documented by CT imaging  . Gallstones   . HOH (hard of hearing)   . Complication of anesthesia   . PONV (postoperative nausea and vomiting)   . Herpes ocular   . Arthritis   . Glaucoma   . Hypothyroidism    Past Surgical History  Procedure Laterality Date  . Breast surgery Left     60 years ago  . Abdominal hysterectomy    . Ercp  03/16/2013    Procedure: ENDOSCOPIC RETROGRADE CHOLANGIOPANCREATOGRAPHY (ERCP) (Duct not cleared of all stones);  Surgeon: Malissa HippoNajeeb U Rehman, MD;  Location: [facility];  Service: Endoscopy;;  . Sphincterotomy  03/16/2013    Procedure: Dennison MascotSPHINCTEROTOMY;  Surgeon: Malissa HippoNajeeb U Rehman, MD;  Location: [facility];  Service: Endoscopy;;  . Balloon dilation  03/16/2013    Procedure: Marvis RepressBALLOON DILATION;  Surgeon: Malissa HippoNajeeb U Rehman, MD;  Location: [facility];  Service: Endoscopy;;  . Removal of stones  03/16/2013    Procedure: REMOVAL OF STONES WITH STONE BASKET;  Surgeon: Malissa HippoNajeeb U Rehman, MD;  Location: [facility];  Service:  Endoscopy;;  . Biliary stent placement  03/16/2013    Procedure: BILIARY STENT PLACEMENT;  Surgeon: Malissa HippoNajeeb U Rehman, MD;  Location: [facility];  Service: Endoscopy;;  . Ercp N/A 05/14/2013    Procedure: ENDOSCOPIC RETROGRADE CHOLANGIOPANCREATOGRAPHY (ERCP);  Surgeon: Malissa HippoNajeeb U Rehman, MD;  Location: [facility];  Service: Endoscopy;  Laterality: N/A;  730  . Removal of stones N/A 05/14/2013    Procedure: REMOVAL OF STONES;  Surgeon: Malissa HippoNajeeb U Rehman, MD;  Location: [facility];  Service: Endoscopy;  Laterality: N/A;  . Stent removal N/A 05/14/2013    Procedure: STENT REMOVAL;  Surgeon: Malissa HippoNajeeb U Rehman, MD;  Location: [facility];  Service: Endoscopy;  Laterality: N/A;  . Sphincterotomy  05/14/2013    Procedure:  EXTENSION OF SPHINCTEROTOMY;  Surgeon: Malissa HippoNajeeb U Rehman, MD;  Location: [facility];  Service: Endoscopy;;   History reviewed. No pertinent family history. History  Substance Use Topics  . Smoking status: Never Smoker   . Smokeless tobacco: Current User    Types: Snuff  . Alcohol Use: No   OB History    No data available     Review of Systems  Constitutional: Negative for fever, chills, diaphoresis, appetite change and fatigue.  HENT: Negative for mouth sores, sore throat and trouble swallowing.   Eyes: Negative for visual disturbance.  Respiratory: Negative for cough, chest tightness, shortness of  breath and wheezing.   Cardiovascular: Negative for chest pain.  Gastrointestinal: Negative for nausea, vomiting, abdominal pain, diarrhea and abdominal distention.  Endocrine: Negative for polydipsia, polyphagia and polyuria.  Genitourinary: Negative for dysuria, frequency and hematuria.  Musculoskeletal: Negative for gait problem.  Skin: Negative for color change, pallor and rash.  Neurological: Positive for weakness. Negative for dizziness, syncope, light-headedness and headaches.  Hematological: Does not bruise/bleed easily.  Psychiatric/Behavioral: Negative for behavioral problems and confusion.       Allergies  Review of patient's allergies indicates no known allergies.  Home Medications   Prior to Admission medications   Medication Sig Start Date End Date Taking? Authorizing Provider  acyclovir (ZOVIRAX) 800 MG tablet Take 800 mg by mouth 2 (two) times daily.   Yes Historical Provider, MD  aspirin EC 81 MG tablet Take 81 mg by mouth every morning.   Yes Historical Provider, MD  Calcium Carbonate-Vitamin D (CALCIUM 600+D) 600-400 MG-UNIT per tablet Take 1 tablet by mouth 2 (two) times daily.   Yes Historical Provider, MD  ciprofloxacin (CIPRO) 250 MG tablet Take 250 mg by mouth 2 (two) times daily. 5 day course starting on 01/20/2014 01/20/14  Yes Historical Provider, MD  Difluprednate (DUREZOL) 0.05 % EMUL Place 1 drop into the left eye every morning.    Yes Historical Provider, MD  docusate sodium (COLACE) 100 MG capsule Take 100 mg by mouth 2 (two) times daily.   Yes Historical Provider, MD  enalapril (VASOTEC) 2.5 MG tablet Take 2.5 mg by mouth every morning.   Yes Historical Provider, MD  furosemide (LASIX) 20 MG tablet Take 20 mg by mouth every morning.    Yes Historical Provider, MD  levothyroxine (SYNTHROID, LEVOTHROID) 50 MCG tablet Take 50 mcg by mouth daily before breakfast.   Yes Historical Provider, MD  megestrol (MEGACE) 40 MG/ML suspension Take 400 mg by mouth daily as needed (for appetite).    Yes Historical Provider, MD  omeprazole (PRILOSEC) 20 MG capsule Take 20 mg by mouth 2 (two) times daily.   Yes Historical Provider, MD  timolol (TIMOPTIC) 0.5 % ophthalmic solution Place 1 drop into the right eye every morning.    Yes Historical Provider, MD  Vitamin D, Ergocalciferol, (DRISDOL) 50000 UNITS CAPS capsule Take 50,000 Units by mouth every 30 (thirty) days.   Yes Historical Provider, MD  HYDROcodone-acetaminophen (NORCO) 5-325 MG per tablet Take 2 tablets by mouth every 4 (four) hours as needed. Patient not taking: Reported on 01/20/2014 08/09/13   Geoffery Lyons,  MD   BP 149/118 mmHg  Pulse 110  Temp(Src) 99.5 F (37.5 C) (Rectal)  Resp 18  SpO2 99% Physical Exam  Constitutional: She is oriented to person, place, and time. She appears well-developed and well-nourished. No distress.  She appears in no distress. She is awake alert and mentating well.  HENT:  Head: Normocephalic.  Eyes: Conjunctivae are normal. Pupils are equal, round, and reactive to light. No scleral icterus.  Neck: Normal range of motion. Neck supple. No thyromegaly present.  Cardiovascular: Regular rhythm.  Tachycardia present.  Exam reveals no gallop and no friction rub.   No murmur heard. Sinus tachycardia at 125 on the monitor. Stable blood pressures. Good peripheral perfusion.  Pulmonary/Chest: Effort normal and breath sounds normal. No respiratory distress. She has no wheezes. She has no rales.  Bilateral breath sounds clear and equal.  Abdominal: Soft. Bowel sounds are normal. She exhibits no distension. There is no tenderness. There is no rebound.  Musculoskeletal: Normal  range of motion.  Neurological: She is alert and oriented to person, place, and time.  Skin: Skin is warm and dry. No rash noted.  Psychiatric: She has a normal mood and affect. Her behavior is normal.    ED Course  Procedures (including critical care time) Labs Review Labs Reviewed  CBC WITH DIFFERENTIAL - Abnormal; Notable for the following:    WBC 13.1 (*)    MCV 111.1 (*)    MCH 36.6 (*)    Neutro Abs 9.0 (*)    Monocytes Absolute 1.4 (*)    All other components within normal limits  URINALYSIS, ROUTINE W REFLEX MICROSCOPIC - Abnormal; Notable for the following:    Hgb urine dipstick TRACE (*)    All other components within normal limits  COMPREHENSIVE METABOLIC PANEL - Abnormal; Notable for the following:    Glucose, Bld 159 (*)    BUN 29 (*)    Creatinine, Ser 1.64 (*)    Albumin 3.2 (*)    GFR calc non Af Amer 26 (*)    GFR calc Af Amer 30 (*)    All other components within  normal limits  BLOOD GAS, ARTERIAL - Abnormal; Notable for the following:    pCO2 arterial 34.3 (*)    All other components within normal limits  URINE MICROSCOPIC-ADD ON - Abnormal; Notable for the following:    Bacteria, UA FEW (*)    Casts HYALINE CASTS (*)    All other components within normal limits  I-STAT CG4 LACTIC ACID, ED - Abnormal; Notable for the following:    Lactic Acid, Venous 3.20 (*)    All other components within normal limits  CULTURE, BLOOD (ROUTINE X 2)  CULTURE, BLOOD (ROUTINE X 2)  URINE CULTURE  PRO B NATRIURETIC PEPTIDE  TROPONIN I  I-STAT CG4 LACTIC ACID, ED  I-STAT CG4 LACTIC ACID, ED    Imaging Review Dg Chest 2 View  01/20/2014   CLINICAL DATA:  Two week history of shortness of breath and nonproductive cough  EXAM: CHEST  2 VIEW  COMPARISON:  Chest radiograph and chest CT August 09, 2013  FINDINGS: There is no edema or consolidation. Heart is upper normal in size with pulmonary vascularity within normal limits. Aorta is tortuous with atherosclerotic change, stable. No bone lesions.  IMPRESSION: No edema or consolidation. Stable aortic tortuosity. This finding suggests chronic hypertension. Heart upper normal in size.   Electronically Signed   By: Bretta Bang M.D.   On: 01/20/2014 18:45     EKG Interpretation   Date/Time:  Wednesday January 20 2014 17:22:51 EST Ventricular Rate:  119 PR Interval:  146 QRS Duration: 79 QT Interval:  334 QTC Calculation: 470 R Axis:   -43 Text Interpretation:  Sinus trhythm Paired ventricular premature complexes  Slow R progression Reconfirmed by Fayrene Fearing  MD, Jossiah Smoak ([id number]) on 01/20/2014  5:59:15 PM      MDM   Final diagnoses:  Shortness of breath  Dehydration    Initial lactate 3.5. Given 500 mL of fluid. Remainder of her labs are reassuring. White blood cell 13.1. Recheck temp's remain afebrile. Urine does not appear infected. Chest x-ray shows no acute changes. Creatinine is up slightly from her  baseline 1.4 to 1.64. Discussion I admit and reinterviewed her and discussed her with family multiple times. She continues to feel excellent after only a small amount of IV fluid. She is ambulatory in the department without unsteadiness or additional symptoms. Family feels strongly he would like to  have her at home.  Caution initially this is a 80 year old with an elevated lactate and leukocytosis. After fluids she has a normal heart rate and repeat lactate is normal she feels well. I did obtain blood and urine cultures. We will follow these. With any additional symptoms worsening of asked the family to have a very low threshold to return. However at this time I feel she is appropriate for outpatient treatment considering she will be closely with family and can return if any worsening symptoms.    Rolland PorterMark Kazuki Ingle, MD 01/20/14 [phone]

## 2014-01-22 LAB — URINE CULTURE
CULTURE: NO GROWTH
Colony Count: NO GROWTH

## 2014-01-25 LAB — CULTURE, BLOOD (ROUTINE X 2)
CULTURE: NO GROWTH
Culture: NO GROWTH

## 2014-01-28 ENCOUNTER — Encounter (INDEPENDENT_AMBULATORY_CARE_PROVIDER_SITE_OTHER): Payer: Self-pay | Admitting: *Deleted

## 2014-02-16 ENCOUNTER — Ambulatory Visit (INDEPENDENT_AMBULATORY_CARE_PROVIDER_SITE_OTHER): Payer: Medicare Other | Admitting: Internal Medicine

## 2014-03-02 ENCOUNTER — Ambulatory Visit (INDEPENDENT_AMBULATORY_CARE_PROVIDER_SITE_OTHER): Payer: Medicare Other | Admitting: Internal Medicine

## 2014-04-05 ENCOUNTER — Ambulatory Visit (INDEPENDENT_AMBULATORY_CARE_PROVIDER_SITE_OTHER): Payer: Medicare Other | Admitting: Internal Medicine

## 2014-06-27 DEATH — deceased

## 2015-02-15 IMAGING — RF DG ERCP WO/W SPHINCTEROTOMY
1 series · 15 of 24 positions shown · non-contrast
Comparison: CT scan 03/16/2013

CLINICAL DATA: Choledocholithiasis

EXAM:
ERCP
TECHNIQUE: Multiple spot images obtained with the fluoroscopic device and
submitted for interpretation post-procedure.

[Series 1: run · 14 acquisitions, 15 frames shown]
[im 1/14]
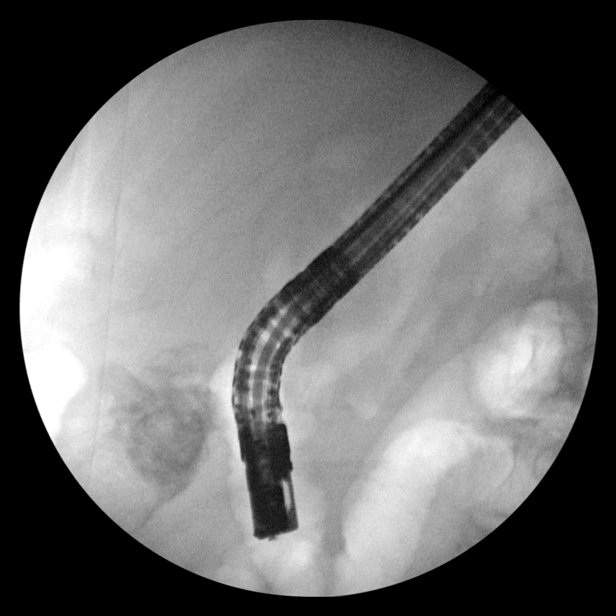
[im 2/14]
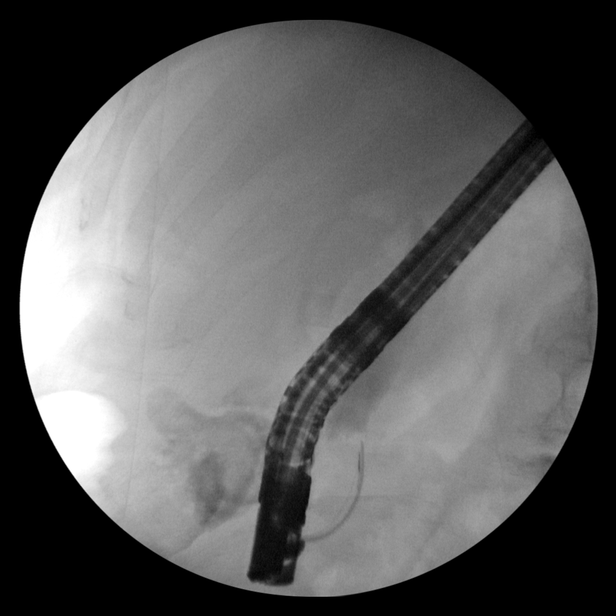
[im 3/14]
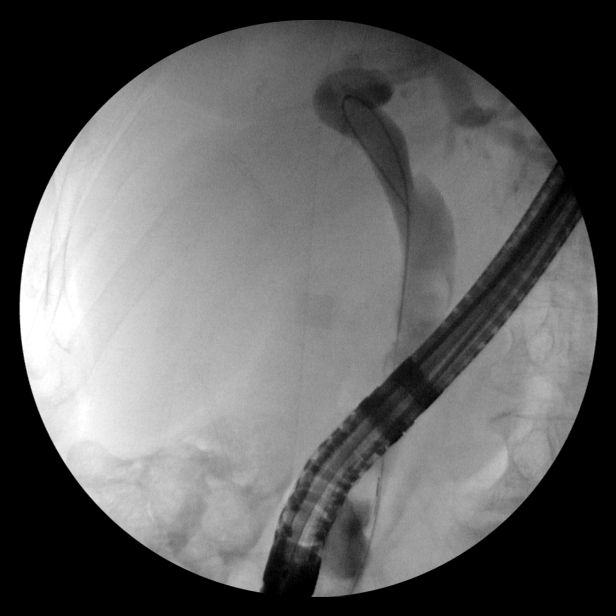
[im 4/14]
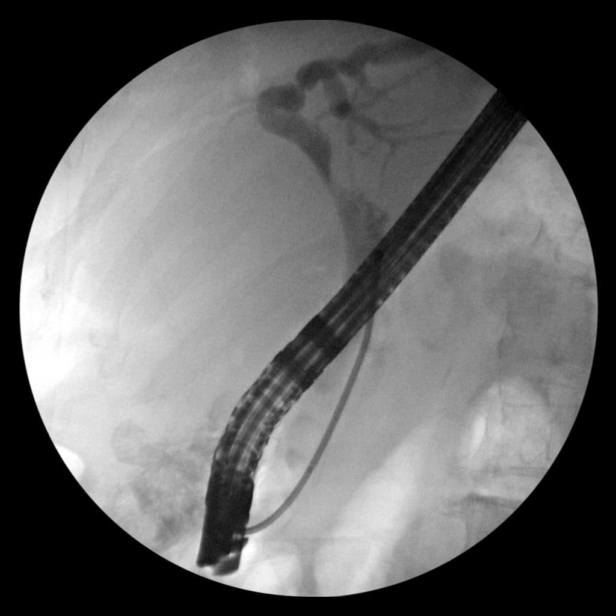
[im 5/14]
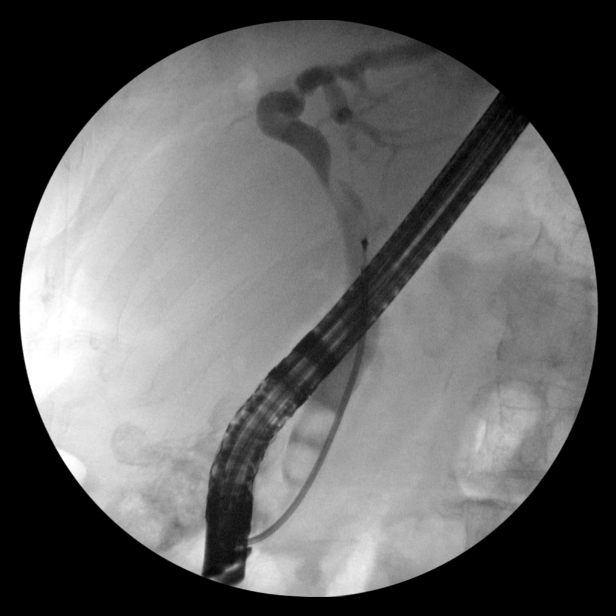
[im 5/14]
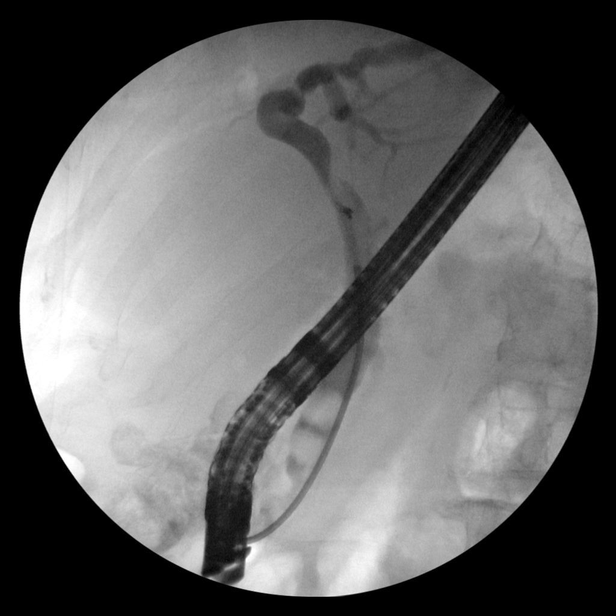
[im 6/14]
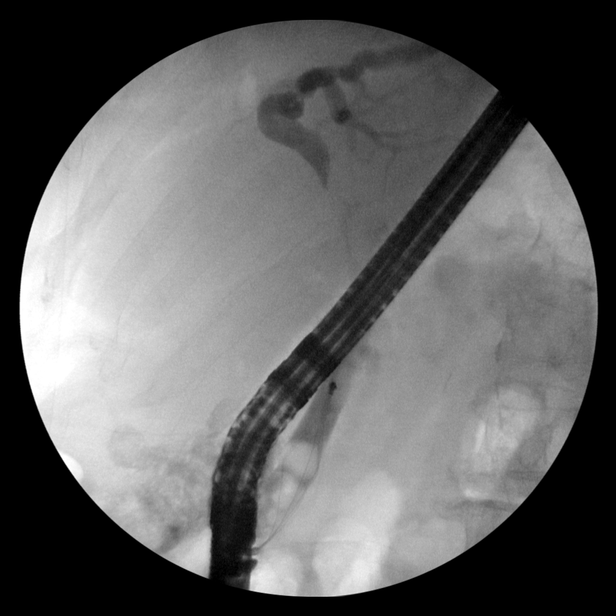
[im 7/14]
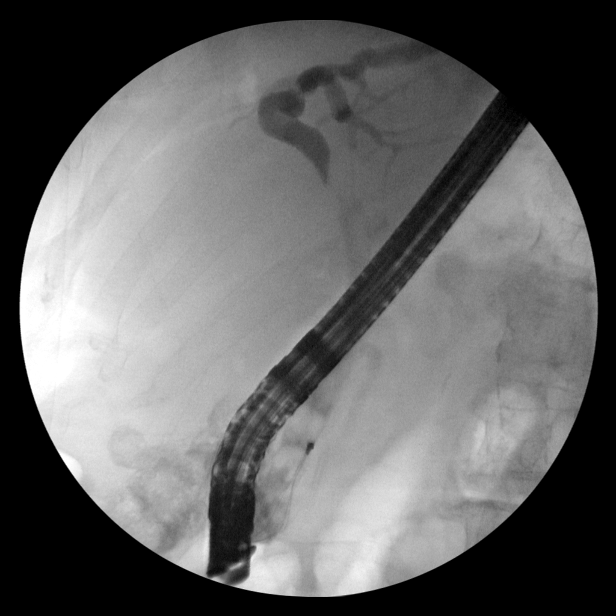
[im 8/14]
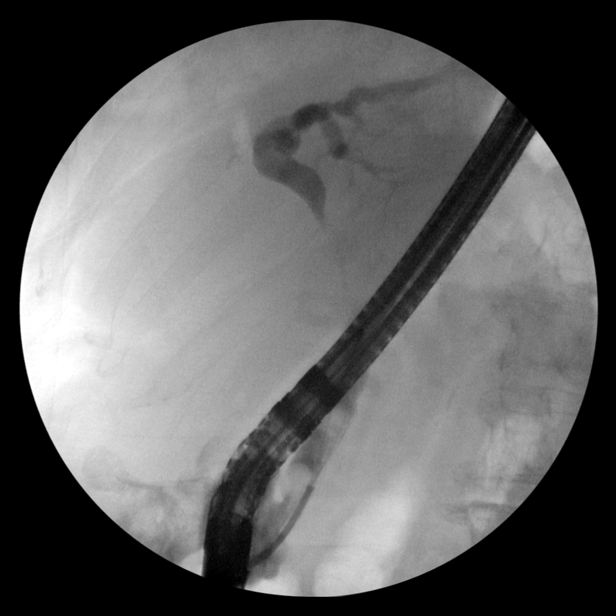
[im 9/14]
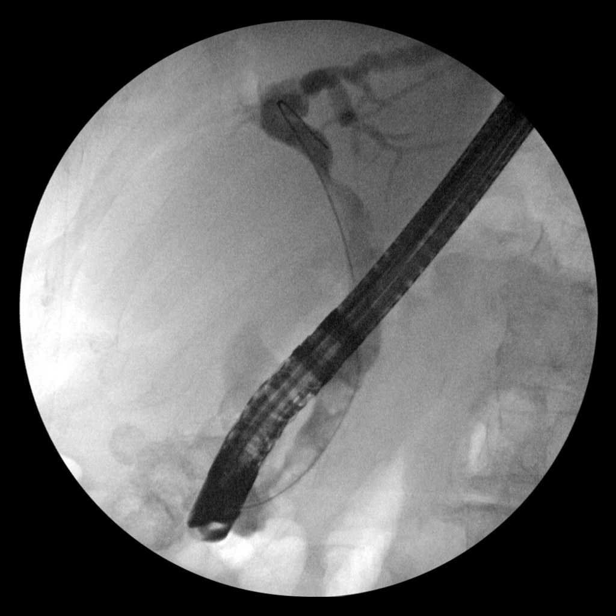
[im 10/14]
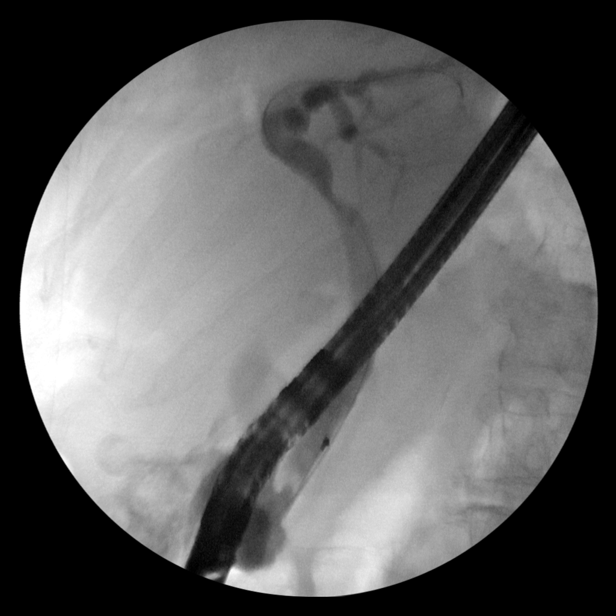
[im 11/14]
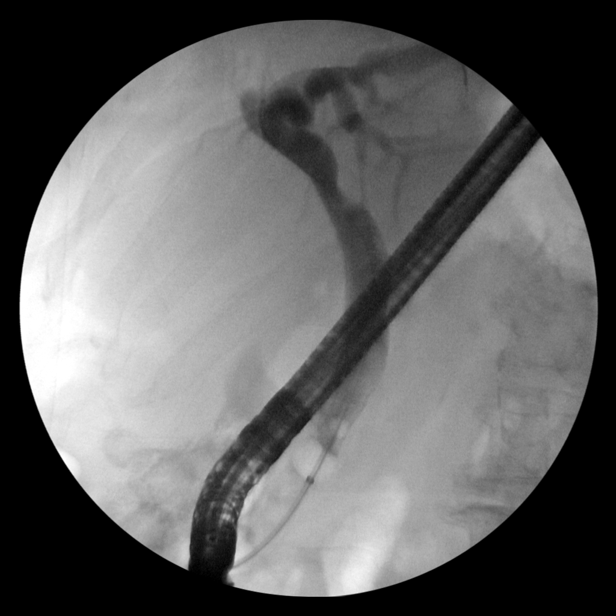
[im 12/14]
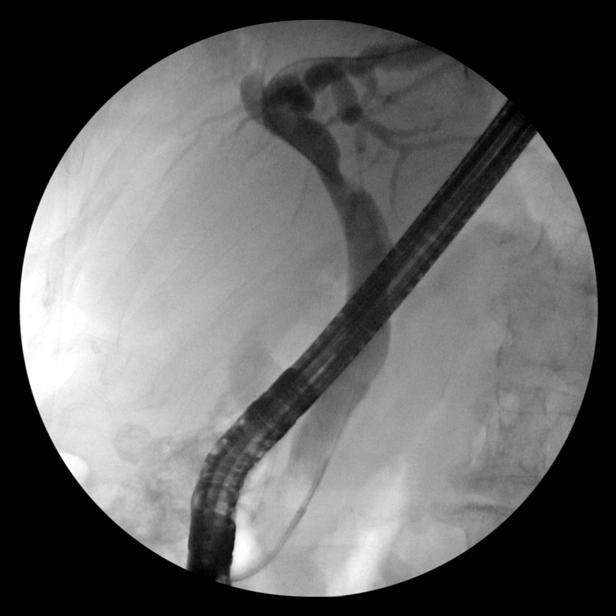
[im 12/14]
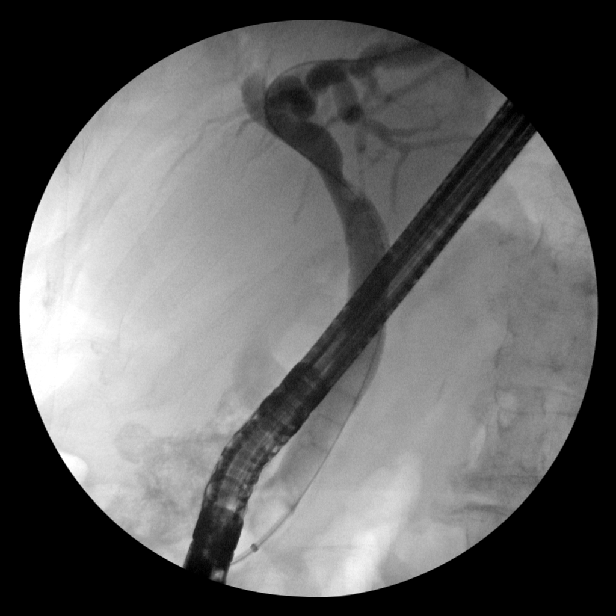
[im 14/14]
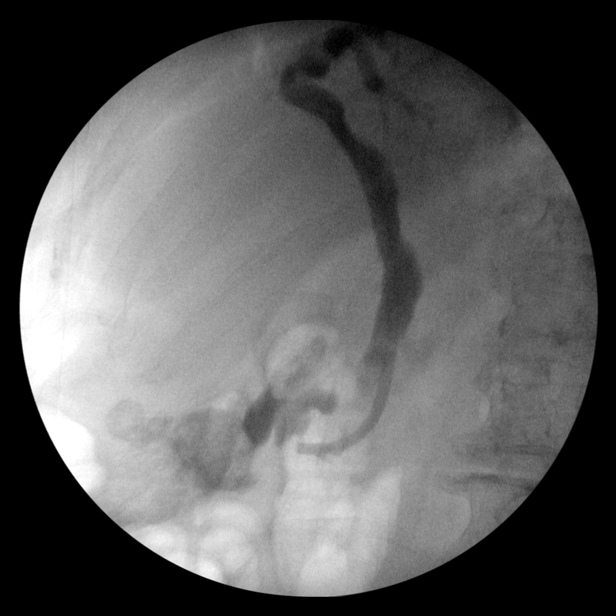

[15 of 24 positions shown; findings below may reference images not displayed]

FINDINGS: Multiple fluoroscopic spot images from an ERCP demonstrate a dilated
common bile duct with multiple large stones. Subsequent
sphincterotomy and balloon passage. The stones remain in the distal
common bile duct.

Mild narrowing near the confluence of the hepatic ducts is likely
due to compression by a dilated gallbladder which correlates with
the CT finding.
IMPRESSION: Choledocholithiasiswith sphincterotomy, balloon passage and plastic
stent placement. Common bowel duct stones remain.

Mild narrowing of the common hepatic duct likely due to a dilated
gallbladder.

These images were submitted for radiologic interpretation only.
Please see the procedural report for the amount of contrast and the
fluoroscopy time utilized.

## 2015-02-15 IMAGING — CR DG CHEST 1V PORT
1 series · 1 of 1 positions shown · non-contrast
Comparison: None.

CLINICAL DATA: Acute respiratory arrest.

EXAM:
PORTABLE CHEST - 1 VIEW

[supine ap]
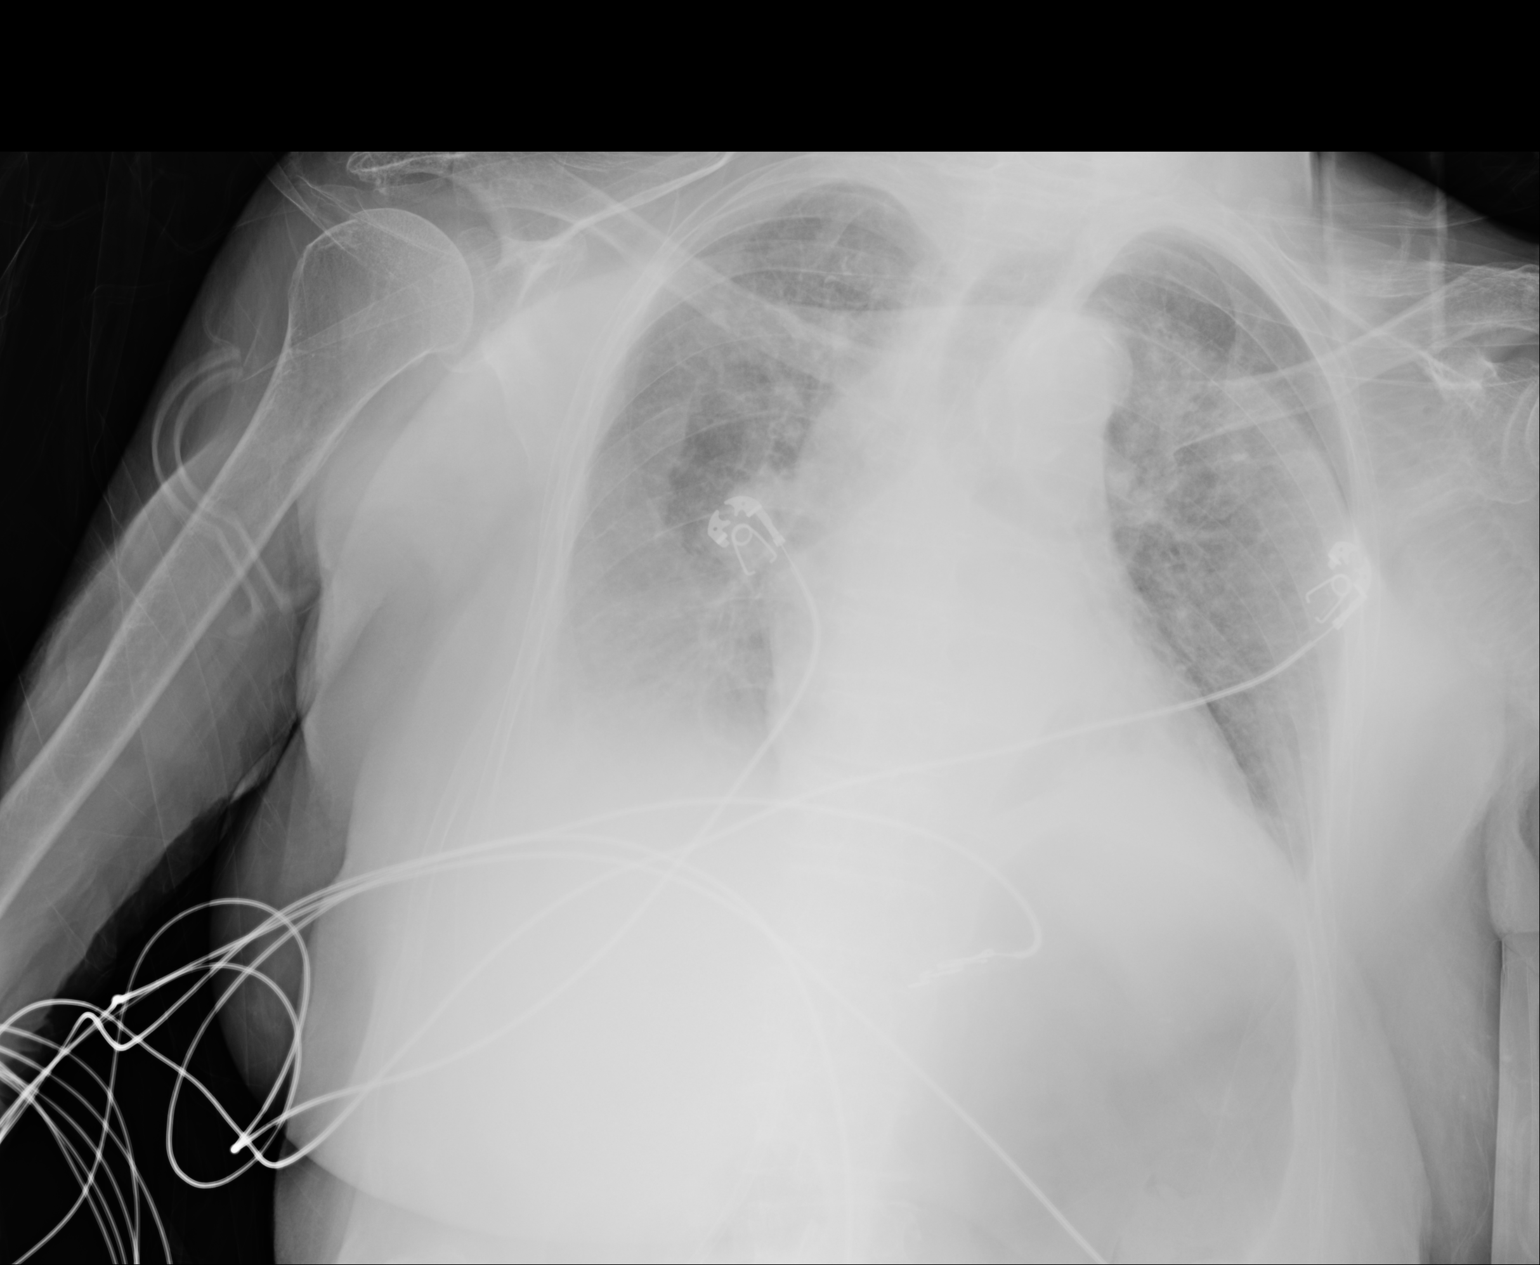

[1 of 1 positions shown; findings below may reference images not displayed]

FINDINGS: Heart size is prominent. Diffuse interstitial infiltrates are seen
suspicious for interstitial edema. Probable small layering right
pleural effusion also noted. No evidence of pulmonary consolidation.
IMPRESSION: Suspect diffuse interstitial edema and small right pleural effusion.

## 2015-02-17 IMAGING — US US ABDOMEN LIMITED
1 series · 14 of 25 positions shown · non-contrast
Comparison: CT 03/16/2013.

CLINICAL DATA: Gallstones.

EXAM:
US ABDOMEN LIMITED - RIGHT UPPER QUADRANT

[Series 1: us abdomen limited · 0.20mm/px · 14 of 53 slices shown]
[im 1/53]
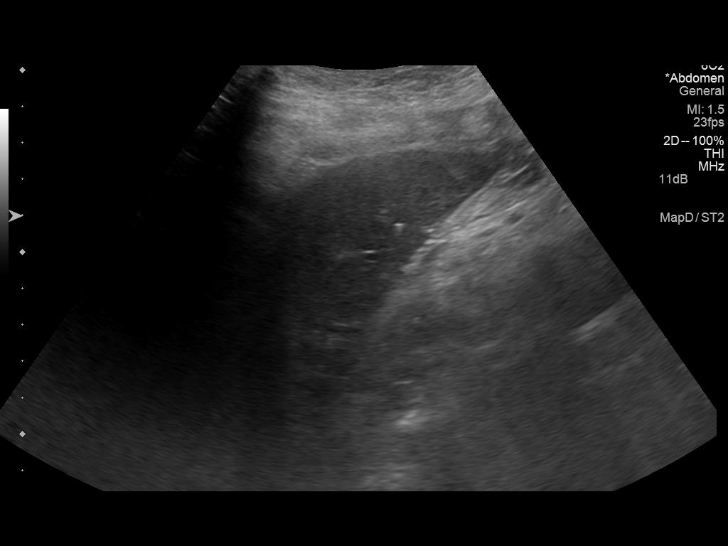
[im 5/53]
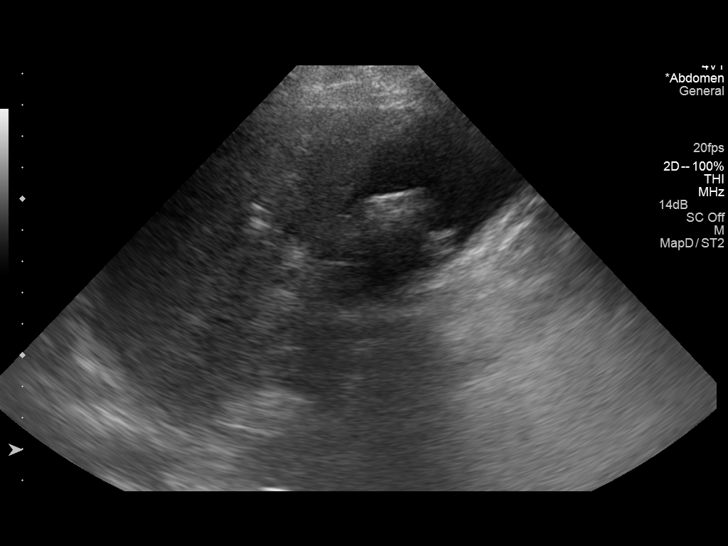
[im 9/53]
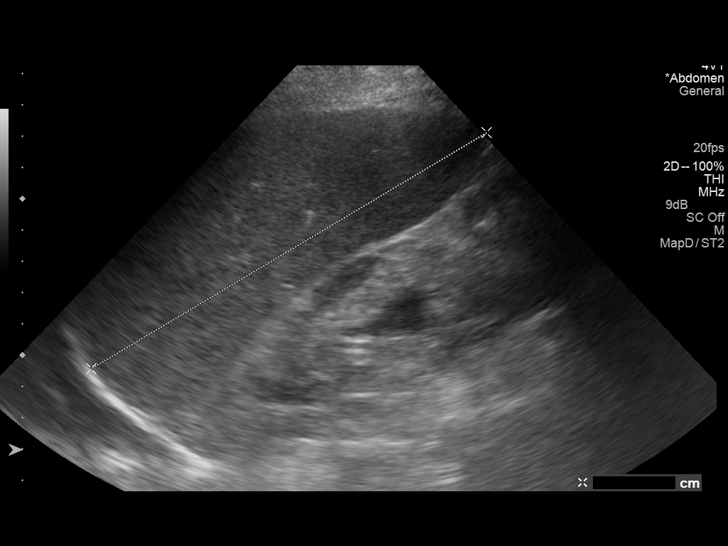
[im 14/53]
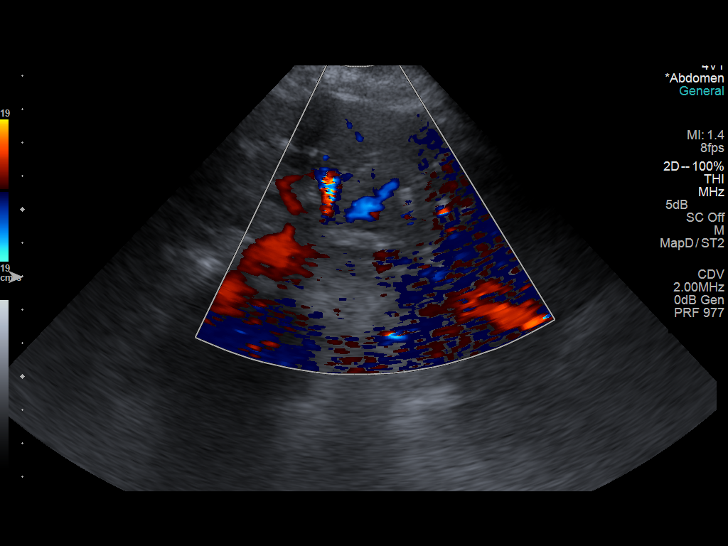
[im 18/53]
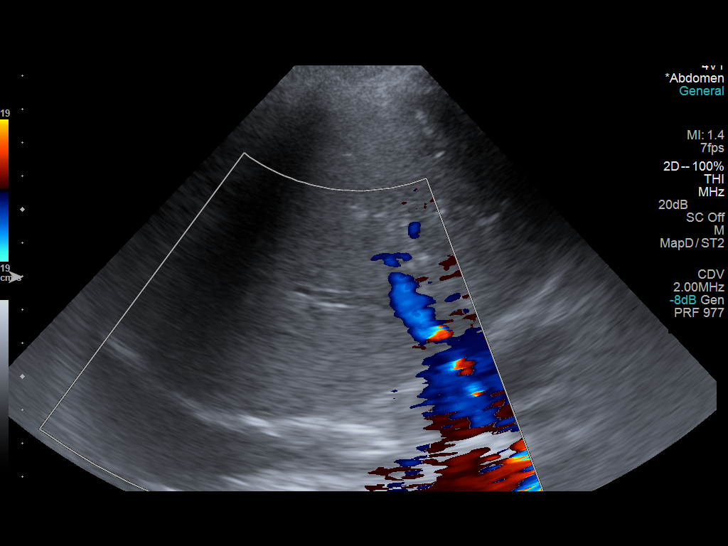
[im 20/53]
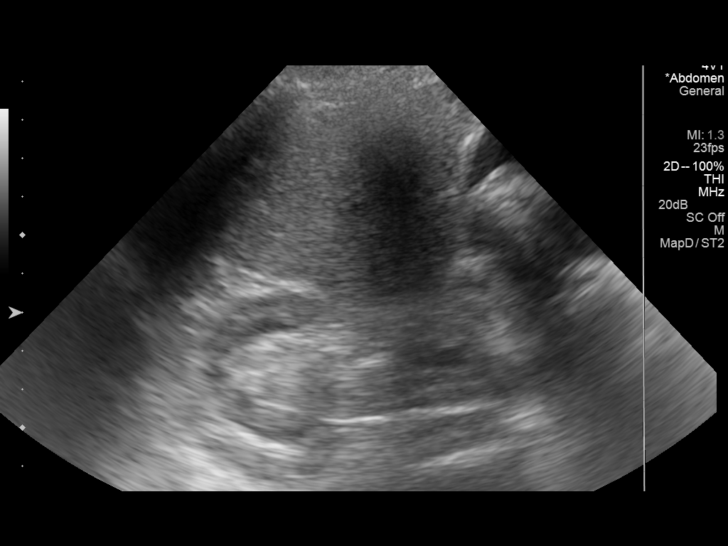
[im 24/53]
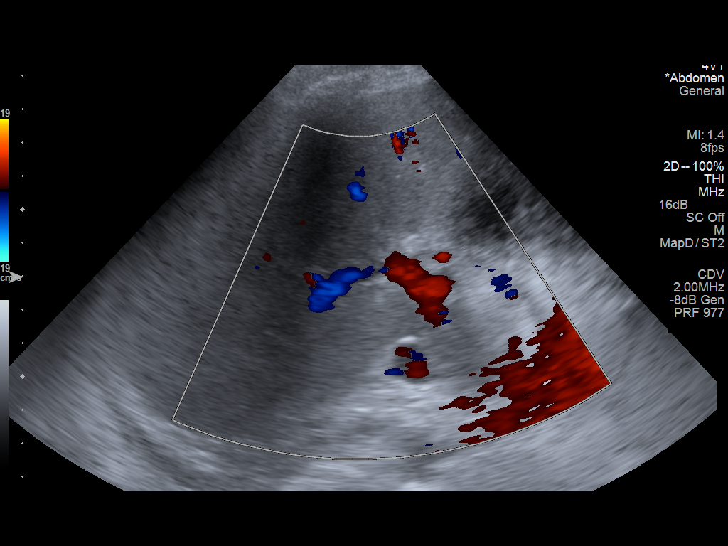
[im 29/53]
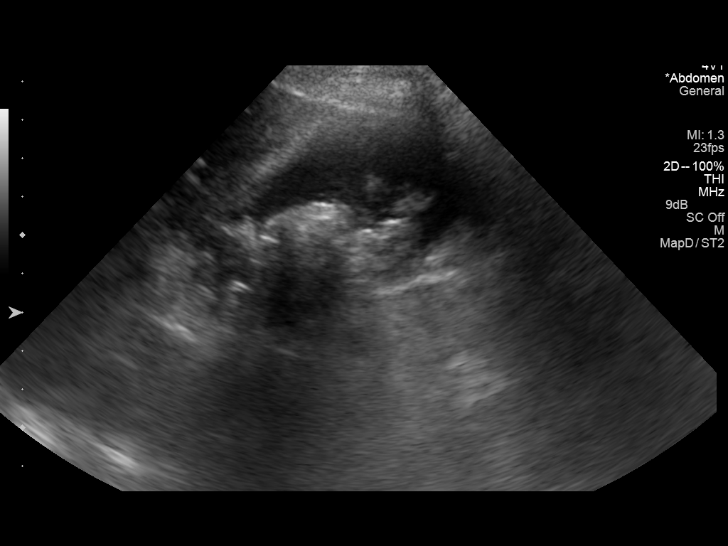
[im 33/53]
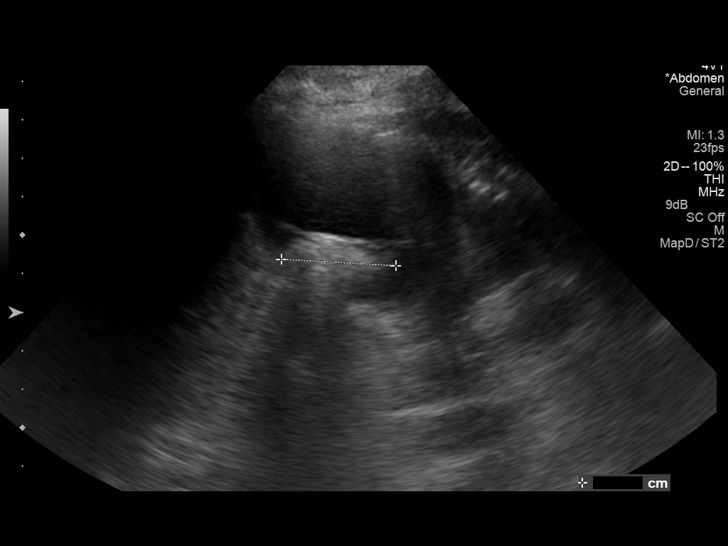
[im 35/53]
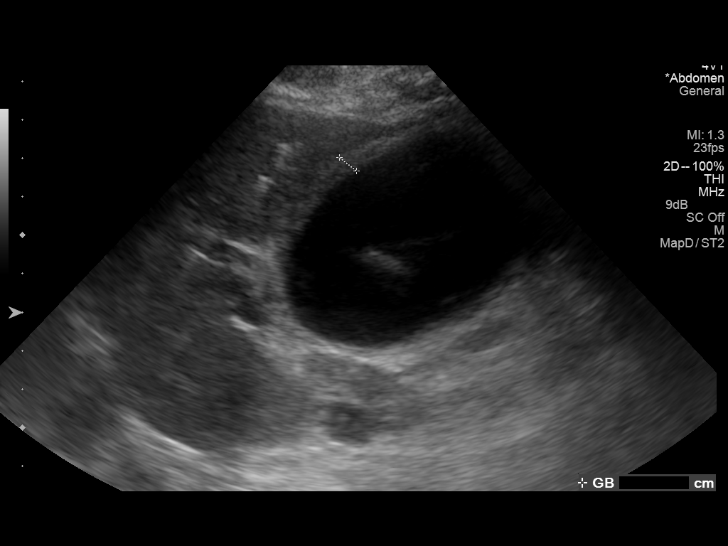
[im 40/53]
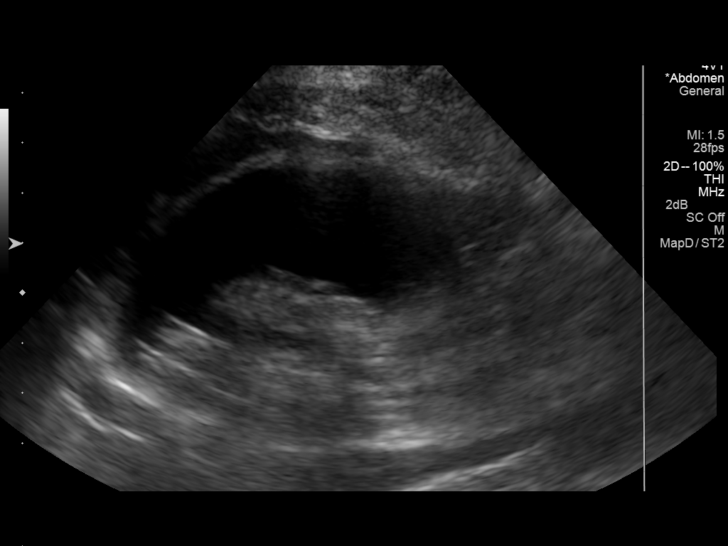
[im 44/53]
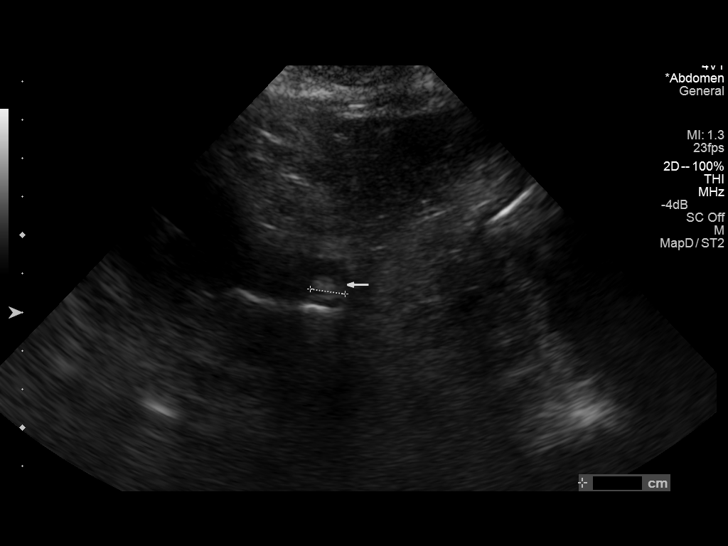
[im 48/53]
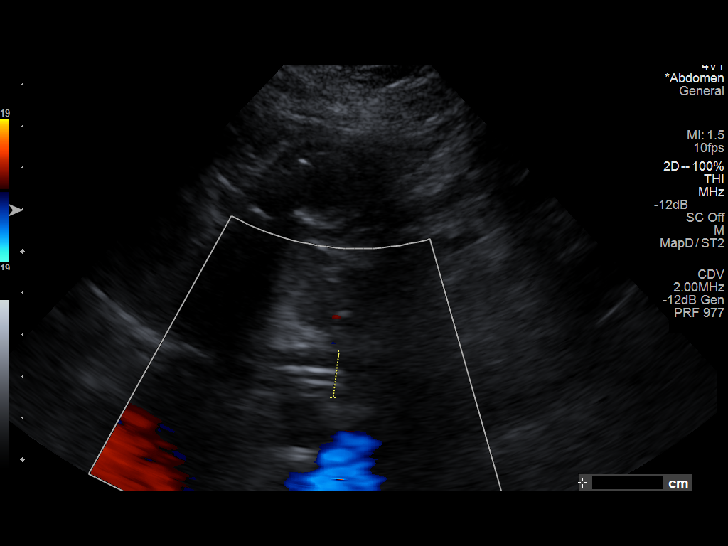
[im 53/53]
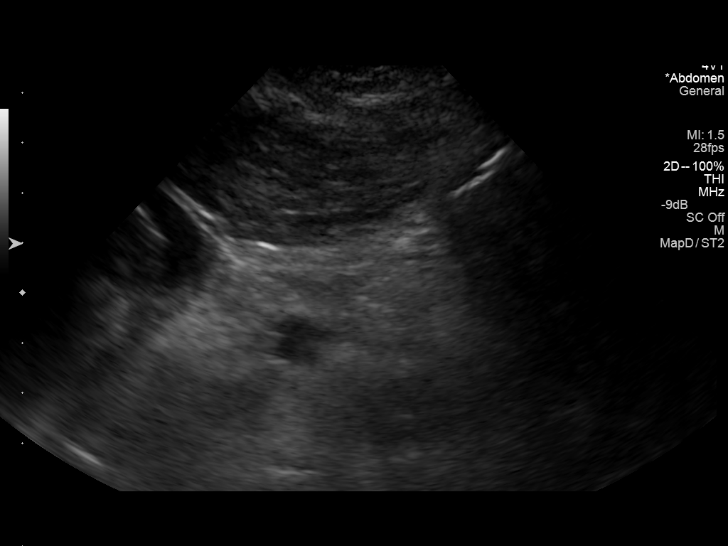

[14 of 25 positions shown; findings below may reference images not displayed]

FINDINGS: Gallbladder:

Multiple mobile gallstones. Gallbladder wall thickness 5.7 mm.
Gallbladder is distended.

Common bile duct:

Diameter: 10.8 mm. A biliary stent is noted. Probable common bile
duct stones present in the distal common bile duct. , image 46. This
measures 9 mm. Intrahepatic biliary ductal distention also present.

Liver:

Multiple calcifications noted throughout the liver and spleen
consistent with granulomas.

Right pleural effusion is noted.
IMPRESSION: 1. Distended gallbladder with multiple gallstones and gallbladder
wall thickening. Cholecystitis cannot be excluded.
2. Intrahepatic and extrahepatic biliary distention. A biliary stent
is noted. 9 mm distal common bile duct stone noted.
3. Right pleural effusion.

## 2015-04-15 IMAGING — RF DG ERCP WO/W SPHINCTEROTOMY
1 series · 15 of 15 positions shown · non-contrast
Comparison: 03/16/2013

CLINICAL DATA: ERCP with sphincterotomy.

EXAM:
ERCP with sphincterotomy
TECHNIQUE: Multiple spot images obtained with the fluoroscopic device and
submitted for interpretation post-procedure.

[Series 1: run · 6 acquisitions, 15 frames shown]
[im 1/6]
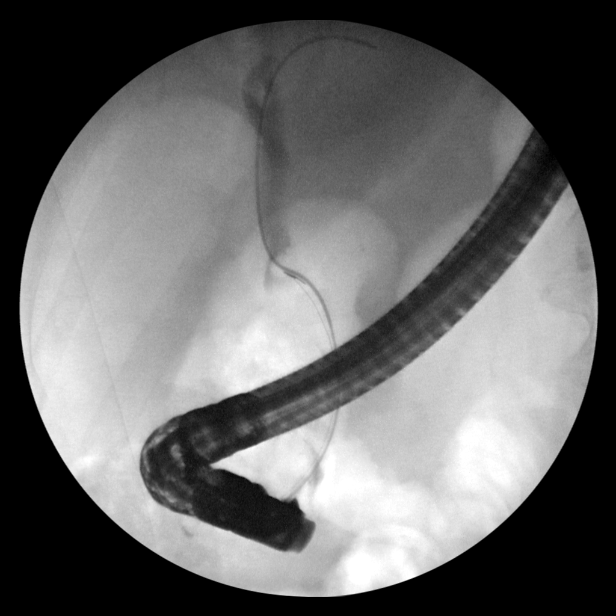
[im 1/6]
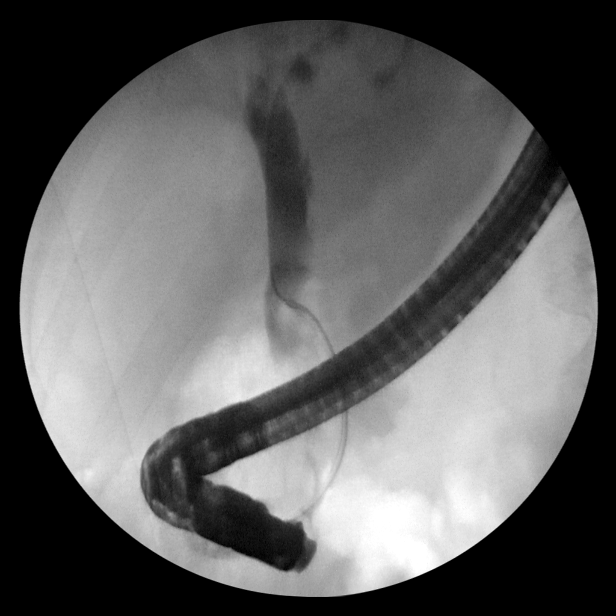
[im 1/6]
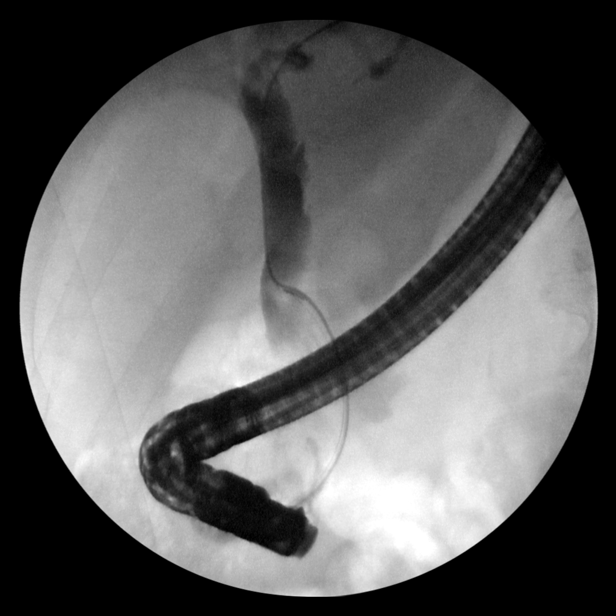
[im 1/6]
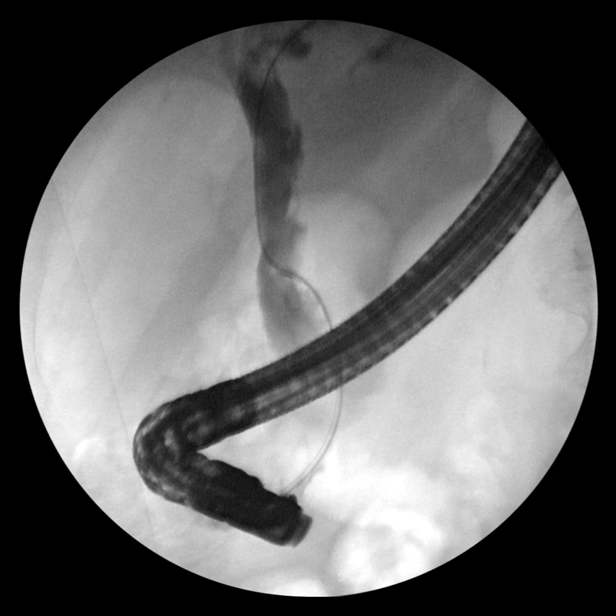
[im 2/6]
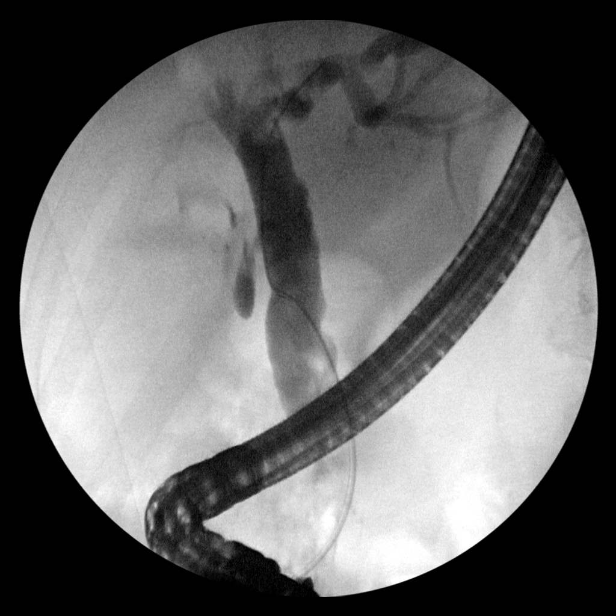
[im 3/6]
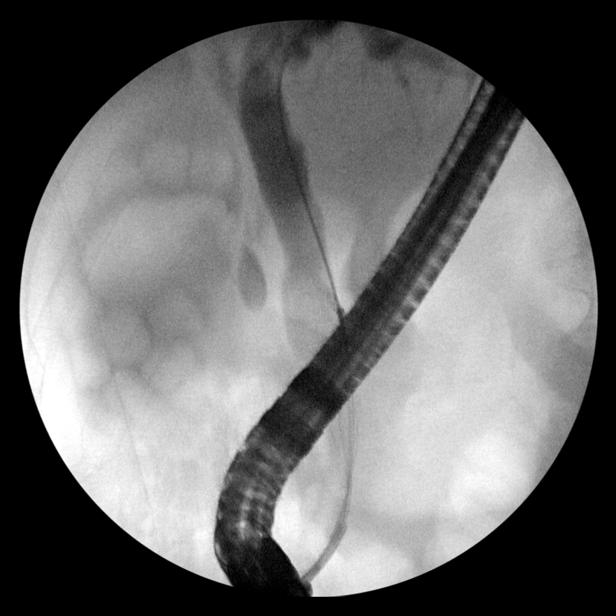
[im 4/6]
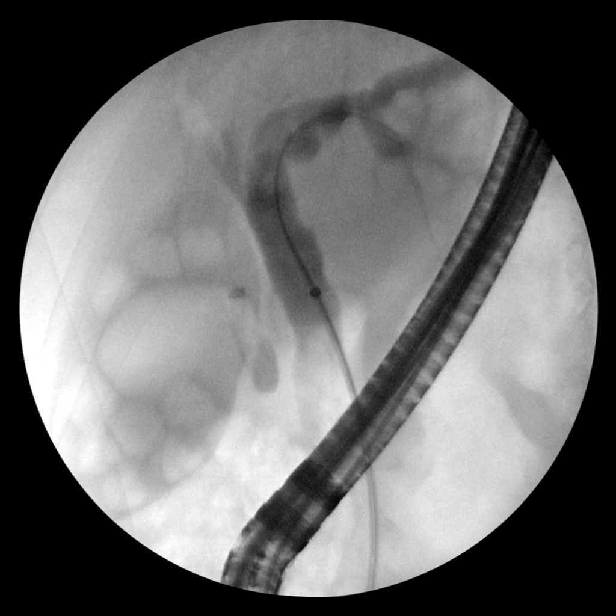
[im 4/6]
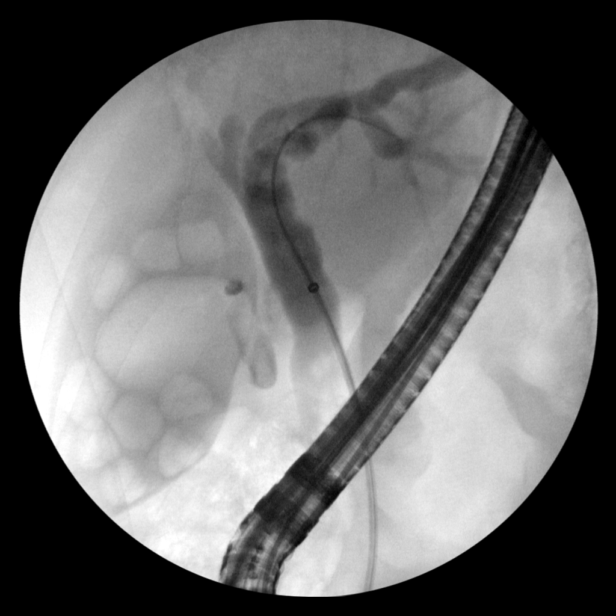
[im 4/6]
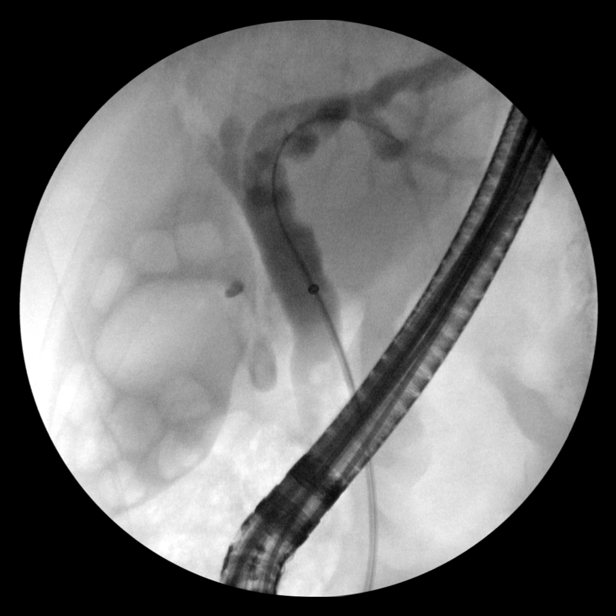
[im 4/6]
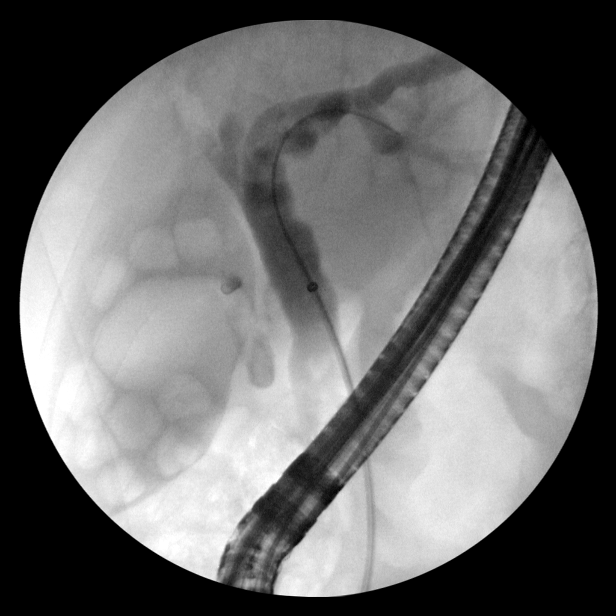
[im 5/6]
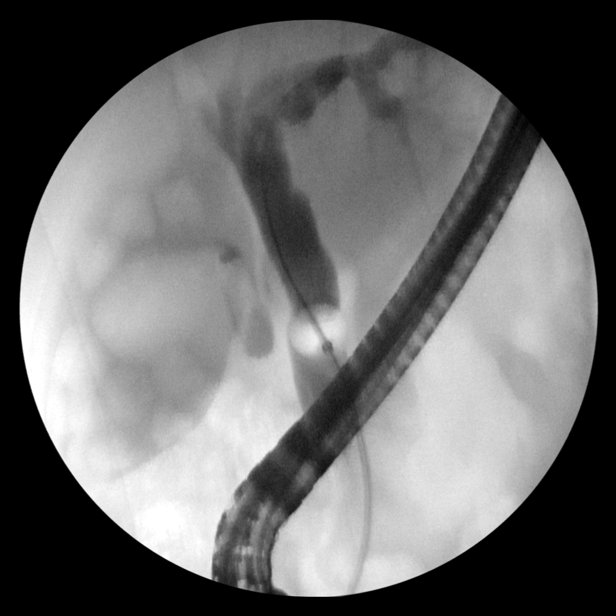
[im 5/6]
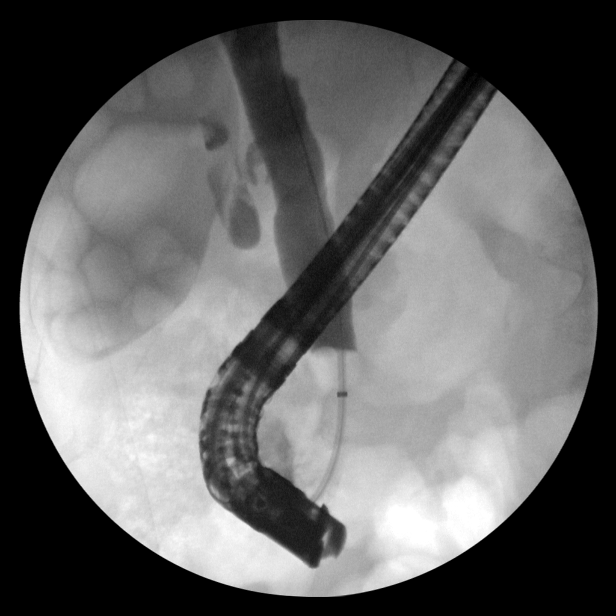
[im 5/6]
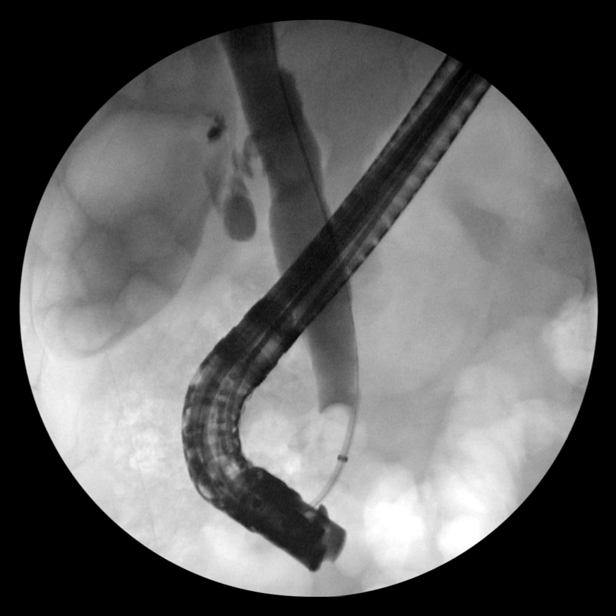
[im 5/6]
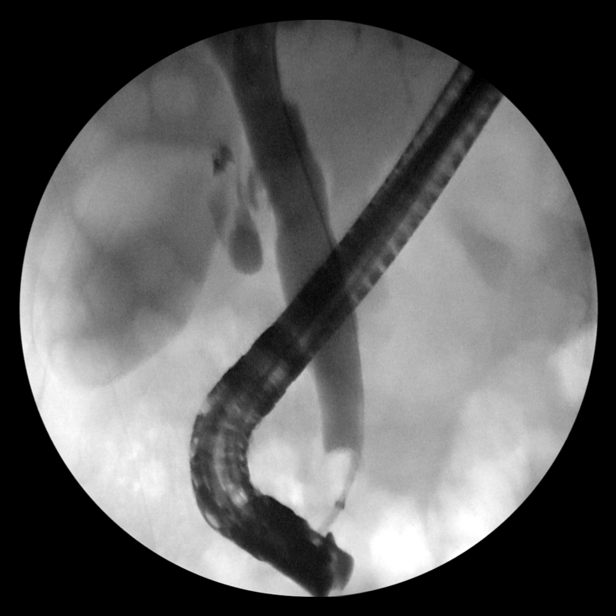
[im 6/6]
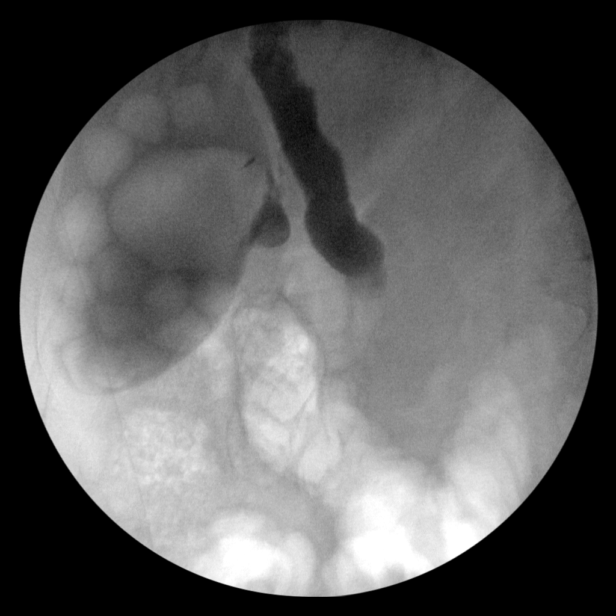

[15 of 15 positions shown; findings below may reference images not displayed]

FINDINGS: Unchanged moderate dilatation of the biliary tree. Bubbles or mobile
stones seen in the distal common bile duct on cine imaging. Basket
and balloon sweeps performed, with free flow through the ampulla
noted on the final image. Patent cystic duct with numerous faceted
gallstones.
IMPRESSION: 1. Choledocholithiasis status post balloon sweep.
2. Patent cystic duct.
3. Cholelithiasis.
These images were submitted for radiologic interpretation only.
Please see the procedural report for the amount of contrast and the
fluoroscopy time utilized.

## 2015-07-11 IMAGING — CT CT ANGIO CHEST
3 of 11 series · 9 of 46 positions shown · IV contrast (Omnipaque 300)
Comparison: CT abdomen and pelvis without contrast 03/16/2013

CLINICAL DATA: Left-sided chest pain.

EXAM:
CT ANGIOGRAPHY CHEST
CT ABDOMEN AND PELVIS WITH CONTRAST
TECHNIQUE: Multidetector CT imaging of the chest was performed using the
standard protocol during bolus administration of intravenous
contrast. Multiplanar CT image reconstructions and MIPs were
obtained to evaluate the vascular anatomy. Multidetector CT imaging
of the abdomen and pelvis was performed using the standard protocol
during bolus administration of intravenous contrast.
CONTRAST:  80mL OMNIPAQUE IOHEXOL 350 MG/ML SOLN

[Series 4: pe 3.0 b40f · axial · 0.54mm/px · z∈[+948,+1070]mm · 4 of 69 slices shown]
[im 14/69  lung]
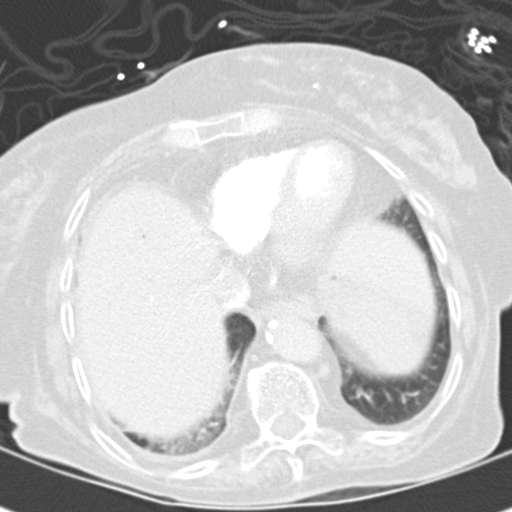
[im 28/69  soft-tissue]
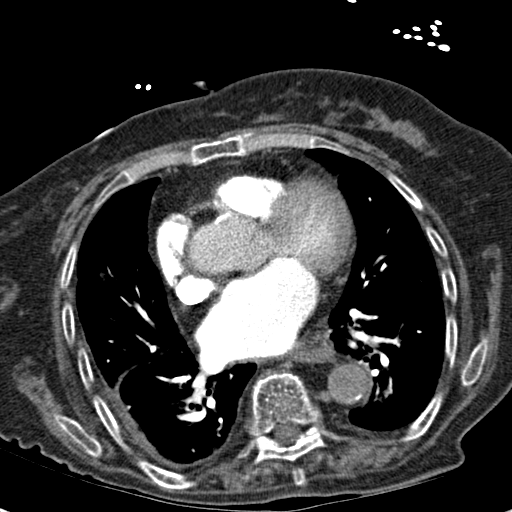
[im 41/69  lung]
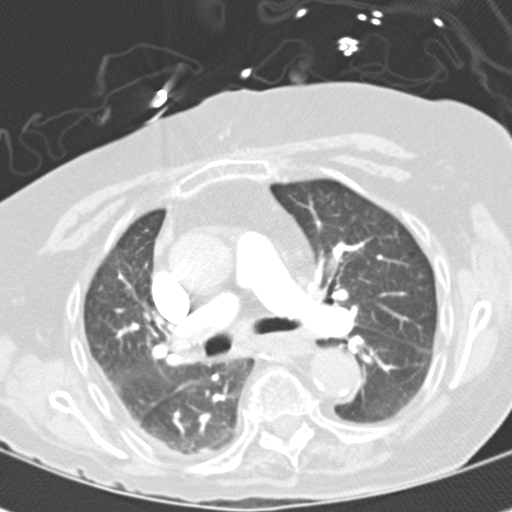
[im 55/69  soft-tissue]
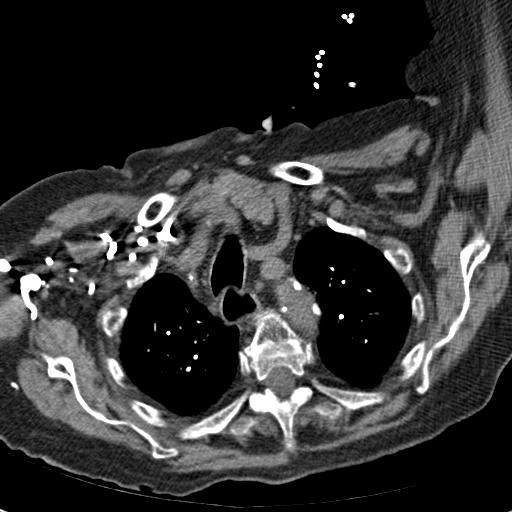

[Series 7: mpr coronal pe · coronal · 0.42mm/px · 1 of 71 slices shown]
[im 36/71  soft-tissue]
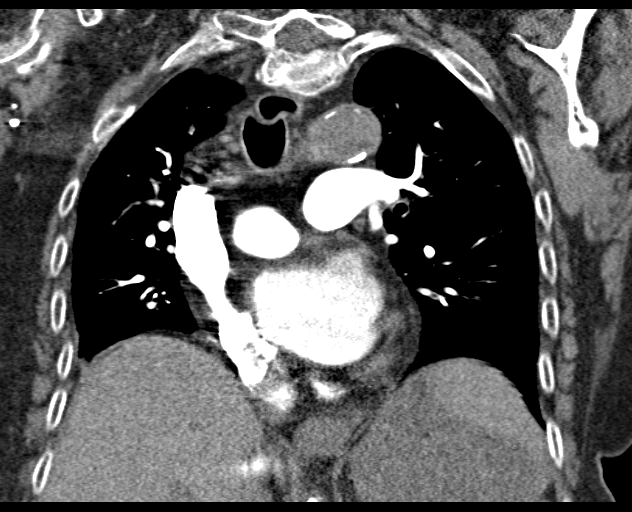

[Series 9: abd_pel 5.0 b40f · axial · 0.63mm/px · z∈[+671,+906]mm · 4 of 79 slices shown]
[im 16/79  lung]
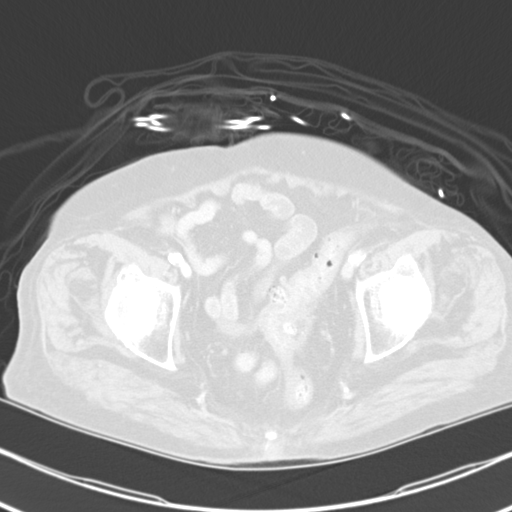
[im 32/79  lung]
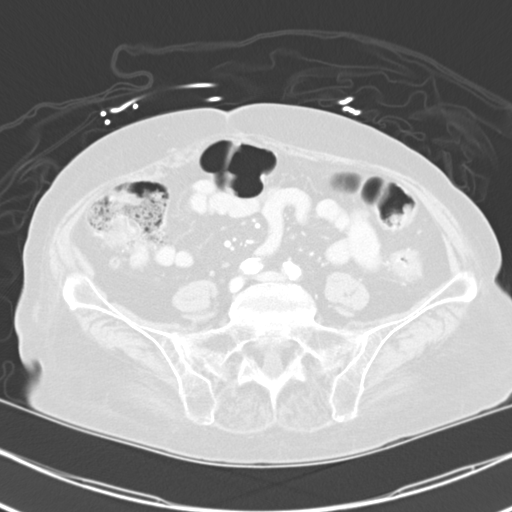
[im 47/79  lung]
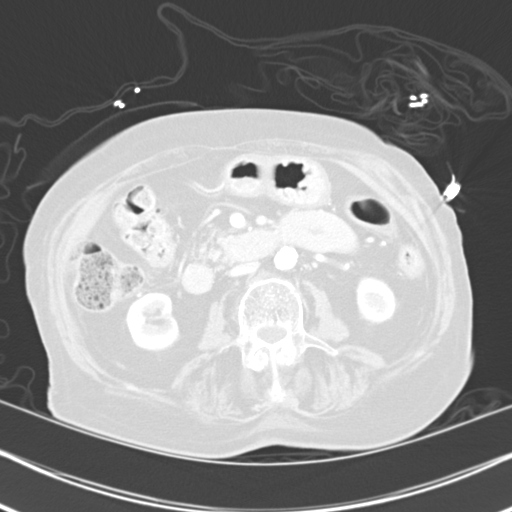
[im 63/79  lung]
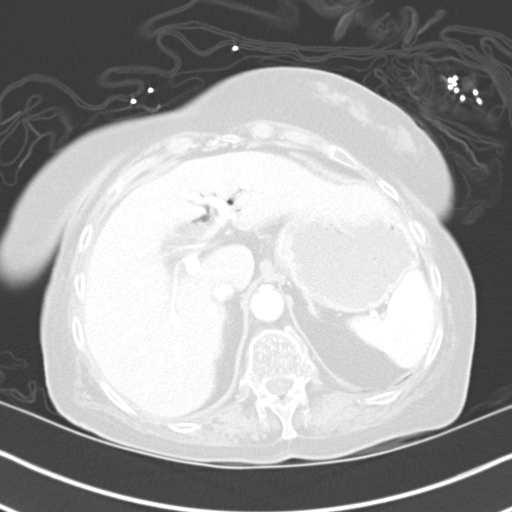

[9 of 46 positions shown; findings below may reference images not displayed]

FINDINGS: CTA CHEST FINDINGS

Pulmonary arterial opacification is excellent. No focal filling
defects are present to suggest pulmonary emboli. The heart is mildly
enlarged. Scattered atherosclerotic calcifications are present in
the aorta without aneurysm. No significant mediastinal or axillary
adenopathy is present.

Extensive patchy ground-glass attenuation is noted. A 1.1 cm nodule
is present in the inferior right upper lobe. Scattered ill-defined
right lower lobe airspace disease is set is associated with a small
irregular fusion. An ill-defined nodule in the left upper lobe
measures 10 mm maximally.

Bone windows demonstrate exaggerated thoracic kyphosis. No focal
lytic or blastic lesions are evident.

CT ABDOMEN and PELVIS FINDINGS

Pneumobilia is present in the liver. There is gas within the
gallbladder. A large ill-defined stone is again seen within the
gallbladder, measuring 2.9 by 2.6 cm. The common bile duct
demonstrate some gas as well. Splenic granulomatous changes are
again seen.

Stomach, duodenum, and pancreas are within normal limits. The
pancreas is atrophic. The adrenal glands are normal bilaterally.
Kidneys are somewhat atrophic. No focal mass lesion or stone is
present. There is no hydronephrosis.

Dense atherosclerotic calcifications are present in the aortic arch
and branch vessels.

Sigmoid diverticular changes are present without diverticulitis. The
remainder the colon is within normal limits. The appendix is
visualized and normal. Small bowel is unremarkable. No significant
adenopathy or free fluid is present. The patient is status post
hysterectomy. The right adnexa is within normal limits. The left
adnexa is not visualized. No significant free fluid is present.
There is no significant adenopathy.

Bone windows demonstrate mild foraminal narrowing bilaterally at
L3-4, L4-5, and L5-S1. No focal lytic or blastic lesions are
present.

Review of the MIP images confirms the above findings.
IMPRESSION: 1. No pulmonary embolus.
2. Diffuse ground-glass attenuation likely represents edema or
atelectasis.
3. Prominent nodules in the upper lobes bilaterally. These may be
inflammatory. Follow-up CT the chest at 1-2 months after treatment
of current symptoms would be useful for further evaluation.
4. New right lower lobe airspace disease and small pleural effusion
compatible with pneumonia.
5. Pneumobilia and gas within the gallbladder are likely associated
to the sphincterotomy performed 03/16/2013.
[DATE]. Sigmoid diverticulosis without diverticulitis.
7. Degenerative changes of the lumbar spine as described.
